# Patient Record
Sex: Female | Born: 1980 | Race: White | Hispanic: No | Marital: Married | Smoking: Never smoker
Health system: Southern US, Community
[De-identification: ages and names within clinical notes are randomized; demographics above are authoritative.]

## PROBLEM LIST (undated history)

## (undated) DIAGNOSIS — F325 Major depressive disorder, single episode, in full remission: Secondary | ICD-10-CM

## (undated) DIAGNOSIS — E039 Hypothyroidism, unspecified: Secondary | ICD-10-CM

## (undated) DIAGNOSIS — O1490 Unspecified pre-eclampsia, unspecified trimester: Secondary | ICD-10-CM

## (undated) DIAGNOSIS — I10 Essential (primary) hypertension: Secondary | ICD-10-CM

## (undated) HISTORY — DX: Hypothyroidism, unspecified: E03.9

## (undated) HISTORY — DX: Major depressive disorder, single episode, in full remission: F32.5

## (undated) HISTORY — DX: Unspecified pre-eclampsia, unspecified trimester: O14.90

## (undated) HISTORY — PX: WISDOM TOOTH EXTRACTION: SHX21

## (undated) HISTORY — DX: Essential (primary) hypertension: I10

---

## 2015-05-14 LAB — LIPID PANEL
Cholesterol: 168 (ref 0–200)
HDL: 89 — AB (ref 35–70)
LDL Cholesterol: 66
Triglycerides: 63 (ref 40–160)

## 2016-04-26 DIAGNOSIS — Z23 Encounter for immunization: Secondary | ICD-10-CM | POA: Diagnosis not present

## 2016-06-02 DIAGNOSIS — H01001 Unspecified blepharitis right upper eyelid: Secondary | ICD-10-CM | POA: Diagnosis not present

## 2016-06-02 DIAGNOSIS — H01004 Unspecified blepharitis left upper eyelid: Secondary | ICD-10-CM | POA: Diagnosis not present

## 2016-06-02 DIAGNOSIS — H04123 Dry eye syndrome of bilateral lacrimal glands: Secondary | ICD-10-CM | POA: Diagnosis not present

## 2016-06-12 DIAGNOSIS — Z23 Encounter for immunization: Secondary | ICD-10-CM | POA: Diagnosis not present

## 2016-06-12 DIAGNOSIS — Z0000 Encounter for general adult medical examination without abnormal findings: Secondary | ICD-10-CM | POA: Diagnosis not present

## 2016-06-12 DIAGNOSIS — E039 Hypothyroidism, unspecified: Secondary | ICD-10-CM | POA: Diagnosis not present

## 2016-11-27 DIAGNOSIS — S300XXA Contusion of lower back and pelvis, initial encounter: Secondary | ICD-10-CM | POA: Diagnosis not present

## 2016-11-27 DIAGNOSIS — W19XXXA Unspecified fall, initial encounter: Secondary | ICD-10-CM | POA: Diagnosis not present

## 2017-01-01 DIAGNOSIS — E039 Hypothyroidism, unspecified: Secondary | ICD-10-CM | POA: Diagnosis not present

## 2017-01-13 DIAGNOSIS — E039 Hypothyroidism, unspecified: Secondary | ICD-10-CM | POA: Diagnosis not present

## 2017-01-27 DIAGNOSIS — Z23 Encounter for immunization: Secondary | ICD-10-CM | POA: Diagnosis not present

## 2017-02-17 DIAGNOSIS — E039 Hypothyroidism, unspecified: Secondary | ICD-10-CM | POA: Diagnosis not present

## 2017-07-28 DIAGNOSIS — H5213 Myopia, bilateral: Secondary | ICD-10-CM | POA: Diagnosis not present

## 2017-08-25 DIAGNOSIS — E039 Hypothyroidism, unspecified: Secondary | ICD-10-CM | POA: Diagnosis not present

## 2017-10-13 DIAGNOSIS — R1010 Upper abdominal pain, unspecified: Secondary | ICD-10-CM | POA: Diagnosis not present

## 2018-01-26 DIAGNOSIS — Z23 Encounter for immunization: Secondary | ICD-10-CM | POA: Diagnosis not present

## 2018-01-26 DIAGNOSIS — E039 Hypothyroidism, unspecified: Secondary | ICD-10-CM | POA: Diagnosis not present

## 2018-01-28 LAB — TSH: TSH: 2.77 (ref <=5.90)

## 2018-04-02 DIAGNOSIS — R1011 Right upper quadrant pain: Secondary | ICD-10-CM | POA: Diagnosis not present

## 2018-04-02 LAB — BASIC METABOLIC PANEL
BUN: 14 (ref 4–21)
Creatinine: 0.7 (ref 0.5–1.1)
Glucose: 72
Potassium: 4.4 (ref 3.4–5.3)
Sodium: 142 (ref 137–147)

## 2018-04-02 LAB — HEPATIC FUNCTION PANEL
ALT: 22 (ref 7–35)
AST: 22 (ref 13–35)
Alkaline Phosphatase: 44 (ref 25–125)
Bilirubin, Total: 0.3

## 2018-04-05 ENCOUNTER — Other Ambulatory Visit: Payer: Self-pay | Admitting: Family Medicine

## 2018-04-05 DIAGNOSIS — R1011 Right upper quadrant pain: Secondary | ICD-10-CM

## 2018-04-16 ENCOUNTER — Ambulatory Visit
Admission: RE | Admit: 2018-04-16 | Discharge: 2018-04-16 | Disposition: A | Discharge status: Home / Self Care | Payer: 59 | Source: Ambulatory Visit | Attending: Family Medicine | Admitting: Family Medicine

## 2018-04-16 DIAGNOSIS — R1011 Right upper quadrant pain: Secondary | ICD-10-CM

## 2018-04-16 DIAGNOSIS — R1010 Upper abdominal pain, unspecified: Secondary | ICD-10-CM | POA: Diagnosis not present

## 2018-04-30 ENCOUNTER — Other Ambulatory Visit: Payer: Self-pay | Admitting: Gastroenterology

## 2018-04-30 DIAGNOSIS — R109 Unspecified abdominal pain: Secondary | ICD-10-CM | POA: Diagnosis not present

## 2018-05-07 ENCOUNTER — Ambulatory Visit
Admission: RE | Admit: 2018-05-07 | Discharge: 2018-05-07 | Disposition: A | Discharge status: Home / Self Care | Payer: 59 | Source: Ambulatory Visit | Attending: Gastroenterology | Admitting: Gastroenterology

## 2018-05-07 DIAGNOSIS — R1011 Right upper quadrant pain: Secondary | ICD-10-CM | POA: Diagnosis not present

## 2018-05-07 DIAGNOSIS — R109 Unspecified abdominal pain: Secondary | ICD-10-CM

## 2018-05-07 MED ORDER — IOPAMIDOL (ISOVUE-300) INJECTION 61%
100.0000 mL | Freq: Once | INTRAVENOUS | Status: AC | PRN
  Administered 2018-05-07: 100 mL via INTRAVENOUS

## 2018-05-18 DIAGNOSIS — R1011 Right upper quadrant pain: Secondary | ICD-10-CM | POA: Diagnosis not present

## 2018-05-20 ENCOUNTER — Ambulatory Visit (INDEPENDENT_AMBULATORY_CARE_PROVIDER_SITE_OTHER): Payer: 59 | Admitting: Family Medicine

## 2018-05-20 ENCOUNTER — Encounter: Payer: Self-pay | Admitting: Family Medicine

## 2018-05-20 VITALS — BP 171/112 | HR 70 | Ht 67.0 in | Wt 127.0 lb

## 2018-05-20 DIAGNOSIS — E039 Hypothyroidism, unspecified: Secondary | ICD-10-CM | POA: Diagnosis not present

## 2018-05-20 DIAGNOSIS — R222 Localized swelling, mass and lump, trunk: Secondary | ICD-10-CM

## 2018-05-20 DIAGNOSIS — R1011 Right upper quadrant pain: Secondary | ICD-10-CM | POA: Diagnosis not present

## 2018-05-20 DIAGNOSIS — F325 Major depressive disorder, single episode, in full remission: Secondary | ICD-10-CM

## 2018-05-20 DIAGNOSIS — R109 Unspecified abdominal pain: Secondary | ICD-10-CM | POA: Diagnosis not present

## 2018-05-20 HISTORY — DX: Major depressive disorder, single episode, in full remission: F32.5

## 2018-05-20 HISTORY — DX: Hypothyroidism, unspecified: E03.9

## 2018-05-20 MED ORDER — DICLOFENAC SODIUM 1 % TD GEL: 4.0000 g | Freq: Four times a day (QID) | TRANSDERMAL | 11 refills | Status: DC

## 2018-05-20 NOTE — Progress Notes (Signed)
Cheryl Christian is a 38 y.o. female who [facility] Cheryl Christian: Primary Care Sports Medicine today for establish care and discuss abdominal pain.  Cheryl Christian is a healthy young woman with a medical history significant for hypothyroidism and well-controlled depression in remission.  She does not have a history of hypertension.  She notes that starting in July 2019 she developed right upper quadrant abdominal pain.  This is been persistent intermittently for the last 6 to 7 months.  The pain was initially pretty well controlled with NSAIDs.  She had some limited work-up off and on including which she reports is normal blood work.  The pain worsened again around December or January and she started having more work-up including a normal blood panel per her recall as well as normal abdominal ultrasound and CT scan of the abdomen and pelvis in February.  She notes the pain is continuing.  Pain is worse with activity and better with rest.  Pain does not seem to change much with eating.  Symptoms are mild to moderate but persistent and annoying.  She is worried they represent something dangerous.  She denies any change with menstruation and feels well otherwise.  She exercises regularly doing some weightlifting and body weight exercises as well as jogging.   ROS as above: No headache, visual changes, nausea, vomiting, diarrhea, constipation, dizziness,  skin rash, fevers, chills, night sweats, weight loss, swollen lymph nodes, body aches, joint swelling, muscle aches, chest pain, shortness of breath, mood changes, visual or auditory hallucinations.    Exam:  BP (!) 171/112   Pulse 70   Ht 5\' 7"  (1.702 m)   Wt 127 lb (57.6 kg)   BMI 19.89 kg/m  Wt Readings from Last 5 Encounters:  05/20/18 127 lb (57.6 kg)    Gen: Well NAD HEENT: EOMI,  MMM Lungs: Normal work of breathing. CTABL Heart: RRR no MRG Abd: NABS, Soft.  Nondistended, tender to palpation right upper quadrant.  With abdominal wall contraction she has a small palpable tender mass just to the right of and superior to the umbilicus.  No umbilical mass.  Not reducible.  Pain is worse with abdominal wall contraction then with rest.  No other abdominal masses palpated.  No rebound or guarding. Exts: Brisk capillary refill, warm and well perfused.   Lab and Radiology Results  Limited bedside musculoskeletal ultrasound obtained today At area of tenderness and palpated nodule patient has a heterogeneous appearance of a mass arising from the muscle layer lateral and superior to the umbilicus.  This measures 0.8 x 1.5 by about 2.5 centimeters.  No increased Doppler activity.  Does not appear to be extending through fascial planes.  Concern for muscle tear.          EXAM: CT ABDOMEN WITH CONTRAST   ADDENDUM REPORT: 05/20/2018 10:36  ADDENDUM: This examination was discussed with Dr Denyse Amassorey on 05/20/2018. Dr Denyse Amassorey reports in office ultrasound revealed a nodule just below the ventral abdominal wall, right of the midline near the umbilicus. After review of CT images there is a well-circumscribed ovoid nodule which appears poorly marginated and has mass effect upon the anterior and inferior liver capsule. This measures approximately 3 by 1.3 by 2.7 cm. This is indeterminate. Potential diagnostic considerations include endometriosis or, less likely peritoneal disease. Further investigation with contrast enhanced MRI of the abdomen is recommended.   Electronically Signed   By: Signa Kellaylor  Stroud M.D.   On: 05/20/2018 10:36  TECHNIQUE:  Multidetector CT imaging of the abdomen was performed using the standard protocol following bolus administration of intravenous contrast.  CONTRAST:  ISOVUE-300 IOPAMIDOL (ISOVUE-300) INJECTION 61%  COMPARISON:  None.  FINDINGS: Lower chest: No acute findings.  Hepatobiliary: No hepatic masses  identified. Gallbladder is unremarkable. No evidence of biliary ductal dilatation.  Pancreas:  No mass or inflammatory changes.  Spleen:  Within normal limits in size and appearance.  Adrenals/Urinary Tract: No masses identified. No evidence of hydronephrosis.  Stomach/Bowel: Visualized portion unremarkable.  Vascular/Lymphatic: No pathologically enlarged lymph nodes identified. No abdominal aortic aneurysm.  Other:  None.  Musculoskeletal:  No suspicious bone lesions identified.  IMPRESSION: Negative.  No acute findings or other significant abnormality.   Electronically Signed   By: Myles Rosenthal M.D.   On: 05/07/2018 12:21  EXAM: ABDOMEN ULTRASOUND COMPLETE  COMPARISON:  None.  FINDINGS: Gallbladder: No gallstones or wall thickening visualized. There is no pericholecystic fluid. No sonographic Murphy sign noted by sonographer.  Common bile duct: Diameter: 2 mm. No intrahepatic, common hepatic, or common bile duct dilatation.  Liver: No focal lesion identified. Within normal limits in parenchymal echogenicity. Portal vein is patent on color Doppler imaging with normal direction of blood flow towards the liver.  IVC: No abnormality visualized.  Pancreas: No pancreatic mass or inflammatory focus.  Spleen: Size and appearance within normal limits.  Right Kidney: Length: 12.4 cm. Echogenicity within normal limits. No mass or hydronephrosis visualized.  Left Kidney: Length: 11.2 cm. Echogenicity within normal limits. No mass or hydronephrosis visualized.  Abdominal aorta: No aneurysm visualized.  Other findings: No demonstrable ascites.  IMPRESSION: Study within normal limits.   Electronically Signed   By: Bretta Bang III M.D.   On: 04/16/2018 14:02 I personally (independently) visualized and performed the interpretation of the images attached in this note.    Assessment and Plan: 38 y.o. female with abdominal pain very  likely arising from abdominal wall musculature.  Concern for tear versus peritoneal findings.  Plan for physical therapy and diclofenac gel.  Additionally we will proceed with MRI with contrast per radiology recommendations.  Blood pressure significantly elevated today.  However patient has a history of normal blood pressures I suspect this is probably anxiety and will recheck on reassessment in 4 to 6 weeks.   PDMP reviewed during this encounter. Orders Placed This Encounter  Procedures  . MR ABDOMEN W CONTRAST    Abdominal wall nodule seen on CT scan and on ultrasound.  Poorly characterized will benefit from MRI with contrast.    Standing Status:   Future    Standing Expiration Date:   07/19/2019    Order Specific Question:   If indicated for the ordered procedure, I authorize the administration of contrast media per Radiology protocol    Answer:   Yes    Order Specific Question:   What is the patient's sedation requirement?    Answer:   No Sedation    Order Specific Question:   Does the patient have a pacemaker or implanted devices?    Answer:   No    Order Specific Question:   Radiology Contrast Protocol - do NOT remove file path    Answer:   [email or web address]    Order Specific Question:   Preferred imaging location?    Answer:   [facility] (table limit-350lbs)  . Ambulatory referral to Physical Therapy    Referral Priority:   Routine    Referral Type:   Physical Medicine  Referral Reason:   Specialty Services Required    Requested Specialty:   Physical Therapy   Meds ordered this encounter  Medications  . diclofenac sodium (VOLTAREN) 1 % GEL    Sig: Apply 4 g topically 4 (four) times daily. To affected joint.    Dispense:  100 g    Refill:  11     Historical information moved to improve visibility of documentation.  Past Medical History:  Diagnosis Date  . Hypothyroid 05/20/2018  . Major depression in complete remission (HCC)  05/20/2018   Past Surgical History:  Procedure Laterality Date  . WISDOM TOOTH EXTRACTION     38 yo   Social History   Tobacco Use  . Smoking status: Never Smoker  . Smokeless tobacco: Never Used  Substance Use Topics  . Alcohol use: Never    Frequency: Never   family history is not on file.  Medications: Current Outpatient Medications  Medication Sig Dispense Refill  . levothyroxine (SYNTHROID, LEVOTHROID) 50 MCG tablet TAKE 1 TABLET BY MOUTH EVERY DAY ON EMPTY STOMACH IN THE MORNING    . sertraline (ZOLOFT) 50 MG tablet Take 50 mg by mouth daily.    . diclofenac sodium (VOLTAREN) 1 % GEL Apply 4 g topically 4 (four) times daily. To affected joint. 100 g 11   No current facility-administered medications for this visit.    No Known Allergies   Discussed warning signs or symptoms. Please see discharge instructions. Patient expresses understanding.

## 2018-05-20 NOTE — Patient Instructions (Addendum)
Thank you for coming in today. I think PT is the next step .  I will ask radiology to look again.  I suspect the next imaging test will be MRI but we may be able to avoid that.  There is something there.   Recheck in 4-6 weeks.  I will keep you updated on what radiology says.   Sign up for mychart.

## 2018-05-27 ENCOUNTER — Encounter: Payer: Self-pay | Admitting: Family Medicine

## 2018-05-31 ENCOUNTER — Encounter: Payer: Self-pay | Admitting: Physical Therapy

## 2018-05-31 ENCOUNTER — Ambulatory Visit: Payer: 59 | Attending: Family Medicine | Admitting: Physical Therapy

## 2018-05-31 DIAGNOSIS — M6281 Muscle weakness (generalized): Secondary | ICD-10-CM | POA: Insufficient documentation

## 2018-05-31 DIAGNOSIS — S39011D Strain of muscle, fascia and tendon of abdomen, subsequent encounter: Secondary | ICD-10-CM | POA: Diagnosis not present

## 2018-05-31 NOTE — Patient Instructions (Addendum)
Access Code: [email or web address]  URL: [email or web address]  Date: 05/31/2018  Prepared by: Ivery Quale   Exercises  TL Sidebending Stretch - Single Arm Overhead - 3 reps - 1 sets - 30 hold - 2x daily - 6x weekly  Prone Press Up - 3 reps - 2-3 sets - 10-30 sec hold - 2x daily - 6x weekly  Sidelying Hip Abduction - 10 reps - 1-3 sets - 2x daily - 6x weekly  Supine Bridge - 10 reps - 2 sets - 5 hold - 2x daily - 6x weekly  Plank on Knees - 3 reps - 1 sets - 30 hold - 2x daily - 6x weekly  Side Plank on Knees - 3 reps - 1 sets - 30 hold - 2x daily - 6x weekly  Bird Dog - 10 reps - 1 sets - 5 sec hold - 2x daily - 6x weekly  Supine Transversus Abdominis Bracing with Leg Extension - 10 reps - 3 sets - 2x daily - 6x weekly  Hooklying Isometric Hip Flexion - 10 reps - 1-2 sets - 5 sec hold - 2x daily - 6x weekly   TENS UNIT: This is helpful for muscle pain and spasm.   Search and Purchase a TENS 7000 2nd edition at www.tenspros.com. It should be less than $30.     TENS unit instructions: Do not shower or bathe with the unit on Turn the unit off before removing electrodes or batteries If the electrodes lose stickiness add a drop of water to the electrodes after they are disconnected from the unit and place on plastic sheet. If you continued to have difficulty, call the TENS unit company to purchase more electrodes. Do not apply lotion on the skin area prior to use. Make sure the skin is clean and dry as this will help prolong the life of the electrodes. After use, always check skin for unusual red areas, rash or other skin difficulties. If there are any skin problems, does not apply electrodes to the same area. Never remove the electrodes from the unit by pulling the wires. Do not use the TENS unit or electrodes other than as directed. Do not change electrode placement without consultating your therapist or physician. Keep 2 fingers with between each electrode. Wear time ratio is 2:1,  on to off times.    For example on for 30 minutes off for 15 minutes and then on for 30 minutes off for 15 minutes

## 2018-05-31 NOTE — Therapy (Signed)
[facility] [address] [place], [place], [address] Phone: [phone]   Fax:  [phone]  Physical Therapy Evaluation  Patient Details  Name: Cheryl Christian MRN: [id number] Date of Birth: [birth year] Referring Provider (PT): Teressa Lower   Encounter Date: 05/31/2018  PT End of Session - 05/31/18 0900    Visit Number  1    Number of Visits  8   pending results of MRI   Date for PT Re-Evaluation  07/12/18    Authorization Type  [employer]    PT Start Time  0802    PT Stop Time  0846    PT Time Calculation (min)  44 min    Activity Tolerance  Patient tolerated treatment well    Behavior During Therapy  [facility] for tasks assessed/performed       Past Medical History:  Diagnosis Date  . Hypothyroid 05/20/2018  . Major depression in complete remission (HCC) 05/20/2018    Past Surgical History:  Procedure Laterality Date  . WISDOM TOOTH EXTRACTION     38 yo    There were no vitals filed for this visit.   Subjective Assessment - 05/31/18 0805    Subjective  Pt relays intermit abdominal pain since July 2019 without know cause of injury. She relays some relief with topical cream and NSAIDS and heat. She can feel the pain is localized to Rt upper quadrant and is wose with sneezing, coughing, crunches. She had imaging showing nodule in abdominal wall and has MRI scheduled 06/07/18.     Pertinent History  hypothyroid    Limitations  Other (comment)    How long can you sit comfortably?  not limited    How long can you stand comfortably?  not limited    How long can you walk comfortably?  not limited    Diagnostic tests  Imaging: had CT showing "nodule in abdominal wall and recommends MRI for further investigation" Has MRI scheduled 06/07/18    Currently in Pain?  Yes    Pain Score  6     Pain Location  Abdomen    Pain Orientation  Right;Upper    Pain Descriptors / Indicators  Aching;Burning;Sore    Pain Type  Chronic pain    Pain Radiating  Towards  denies    Pain Onset  More than a month ago    Pain Frequency  Intermittent    Aggravating Factors   coughing, sneezing, crunches    Pain Relieving Factors  topical cream, heat, NSAIDS    Multiple Pain Sites  No         [facility] PT Assessment - 05/31/18 0001      Assessment   Medical Diagnosis  Suspect abd wall tear    Referring Provider (PT)  Denyse Amass, E    Onset Date/Surgical Date  --   Chronic pain since July 2019   Next MD Visit  not scheduled    Prior Therapy  PT      Precautions   Precautions  None      Balance Screen   Has the patient fallen in the past 6 months  No      Home Environment   Living Environment  Private residence      Prior Function   Level of Independence  Independent      Cognition   Overall Cognitive Status  Within Functional Limits for tasks assessed      Observation/Other Assessments   Focus on Therapeutic Outcomes (FOTO)  not done as may be one time visit and she is not limited functionally      Sensation   Light Touch  Appears Intact      Coordination   Gross Motor Movements are Fluid and Coordinated  Yes      Posture/Postural Control   Posture Comments  WNL      ROM / Strength   AROM / PROM / Strength  AROM;Strength      AROM   Overall AROM Comments  lumbar AROM WNL but does have pull in Rt abdominal with extension and Lt sidebending      Strength   Overall Strength Comments  core strength fair, LE strength WNL except hip abd 4+/5 MMT bilat      Palpation   Palpation comment  tenderness to palpation in Rt abdominal upper quadrant, obliques, rectus abdominous      Special Tests   Other special tests  neg SLR, neg FABERS,      Transfers   Transfers  Independent with all Transfers      Ambulation/Gait   Gait Comments  WNL gait       Objective measurements completed on examination: See above findings.    Exercises and stretches performed today: Prone press up to stretch abdominals 30 sec X 1 and standing  sidebending stretch to Lt with outstretched arm 30 sec X 1 Transverse abdominal contractions 10 sec X 3, then with leg ext X 1 ea, then with isometric hip flexion X 1 ea  [facility] Adult PT Treatment/Exercise - 05/31/18 0001      Modalities   Modalities  Moist Heat;Electrical Stimulation      Moist Heat Therapy   Number Minutes Moist Heat  15 Minutes    Moist Heat Location  Other (comment)   abdominals     Electrical Stimulation   Electrical Stimulation Location  Rt abdominals    Electrical Stimulation Action  IFC    Electrical Stimulation Parameters  tolerance    Electrical Stimulation Goals  Pain             PT Education - 05/31/18 0859    Education Details  HEP, POC, TENS    Person(s) Educated  Patient    Methods  Explanation;Demonstration;Verbal cues;Handout    Comprehension  Verbalized understanding;Returned demonstration          PT Long Term Goals - 05/31/18 0908      PT LONG TERM GOAL #1   Title  Pt will be I and compliant with HEP. ( 6 weeks 07/12/18)    Status  New      PT LONG TERM GOAL #2   Title  Pt will report no more than 2/10 pain with usual activity and with taking care of her kids. ( 6 weeks 07/12/18)    Baseline  6    Status  New      PT LONG TERM GOAL #3   Title  Pt will be able to hold plank and sideplank at least 45 seconds to show good core stabilization. ( 6 weeks 07/12/18)    Status  New             Plan - 05/31/18 0901    Clinical Impression Statement  Pt presents with Rt abdominal pain and Suspected strain or abd wall tear. She will have MRI on 06/07/18 so PT will be held until MRI results are attained. She was given HEP for gentle core stabilization and stretching along with recommendation for home  TENS unit for pain.  She will F/U with MD after MRI and then if she is referred back to PT pending MRI results then she would benefit from PT for core exercises and pain management.     Personal Factors and Comorbidities  Comorbidity 1     Comorbidities  hypothyroid    Stability/Clinical Decision Making  Stable/Uncomplicated    Clinical Decision Making  Low    Rehab Potential  Good    PT Frequency  Other (comment)   1-2   PT Duration  6 weeks    PT Treatment/Interventions  Electrical Stimulation;Iontophoresis /ml Dexamethasone;Moist Heat;Ultrasound;Therapeutic activities;Therapeutic exercise;Neuromuscular re-education;Passive range of motion;Dry needling;Taping    PT Next Visit Plan  core exercises and abdominal stretching, STM and modalties PRN    PT Home Exercise Plan  prone press up for ab stretch, Lt sidebending for ab stretch with outstretched arm, TAC with leg ext, TAC isometric hip flexion, bridge, birdog, plank on knees, sideplank on knees    Consulted and Agree with Plan of Care  Patient       Patient will benefit from skilled therapeutic intervention in order to improve the following deficits and impairments:  Decreased activity tolerance, Decreased endurance, Decreased strength, Increased fascial restricitons, Increased muscle spasms, Pain  Visit Diagnosis: Muscle weakness (generalized)  Abdominal muscle strain, subsequent encounter     Problem List Patient Active Problem List   Diagnosis Date Noted  . 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 Hypothyroid 05/20/2018  . Abdominal wall mass 05/20/2018    Birdie Riddle 05/31/2018, 9:15 AM  [facility] [address] [place], [place], [address] Phone: [phone]   Fax:  [phone]  Name: Cheryl Christian MRN: [id number] Date of Birth: [birth year]

## 2018-06-01 ENCOUNTER — Telehealth: Payer: Self-pay | Admitting: Family Medicine

## 2018-06-01 NOTE — Telephone Encounter (Signed)
Received records from [place]. We will abstract labs however quick review of labs were essentially normal.  Pap smear not included.

## 2018-06-02 ENCOUNTER — Encounter: Payer: Self-pay | Admitting: Family Medicine

## 2018-06-07 ENCOUNTER — Ambulatory Visit: Payer: 59

## 2018-06-07 DIAGNOSIS — R1011 Right upper quadrant pain: Secondary | ICD-10-CM

## 2018-06-07 DIAGNOSIS — R222 Localized swelling, mass and lump, trunk: Secondary | ICD-10-CM

## 2018-06-07 DIAGNOSIS — R109 Unspecified abdominal pain: Secondary | ICD-10-CM

## 2018-06-07 MED ORDER — GADOBENATE DIMEGLUMINE 529 MG/ML IV SOLN
10.0000 mL | Freq: Once | INTRAVENOUS | Status: AC | PRN
  Administered 2018-06-07: 10 mL via INTRAVENOUS

## 2018-06-22 ENCOUNTER — Ambulatory Visit (INDEPENDENT_AMBULATORY_CARE_PROVIDER_SITE_OTHER): Payer: 59 | Admitting: Family Medicine

## 2018-06-22 ENCOUNTER — Other Ambulatory Visit: Payer: Self-pay

## 2018-06-22 ENCOUNTER — Encounter: Payer: Self-pay | Admitting: Family Medicine

## 2018-06-22 VITALS — BP 154/103 | HR 72 | Temp 98.0°F | Wt 127.0 lb

## 2018-06-22 DIAGNOSIS — N809 Endometriosis, unspecified: Secondary | ICD-10-CM

## 2018-06-22 DIAGNOSIS — R222 Localized swelling, mass and lump, trunk: Secondary | ICD-10-CM

## 2018-06-22 DIAGNOSIS — I10 Essential (primary) hypertension: Secondary | ICD-10-CM

## 2018-06-22 HISTORY — DX: Essential (primary) hypertension: I10

## 2018-06-22 MED ORDER — HYDROCHLOROTHIAZIDE 12.5 MG PO TABS: 12.5000 mg | ORAL_TABLET | Freq: Every day | ORAL | 1 refills | Status: DC

## 2018-06-22 MED ORDER — NORGESTIMATE-ETH ESTRADIOL 0.25-35 MG-MCG PO TABS: 1.0000 | ORAL_TABLET | Freq: Every day | ORAL | 4 refills | Status: DC

## 2018-06-22 NOTE — Progress Notes (Signed)
Cheryl Christian is a 38 y.o. female who presents to [facility]: Primary Care Sports Medicine today for follow-up abdominal pain and hypertension.  Cheryl Christian has been seen multiple times for subtle abdominal pain located in her upper right quadrant.  She had ultrasound that was nondiagnostic and CT scan that on repeat read did show a small mass.  She had follow-up MRI with and without IV contrast that showed a subtle mass on the posterior margin of the right rectus abdominis adjacent to the inferior margin of the left hepatic lobe.  This was thought to be potentially endometriosis or other peritoneal disease.  When asked she notes that sometimes her symptoms do seem to cycle with her menstrual period.  She notes that her pain will typically worsen a day or 2 after her maximum cramping for with her menstrual cycle.  She currently is using vasectomy for birth control.  In the past she is tolerated combined oral contraceptives quite well.  She is done Ortho-Cyclen in the past which she tolerated..  Additionally she has elevated blood pressure. Cheryl Christian notes a positive family history for hypertension.  Her mother required blood pressure medications at about the same age that Cheryl Christian is now.  Additionally she has been keeping a home blood pressure log.  Her blood pressures range into the 130s or 140s frequently.  She does have some times where her systolic blood pressure is in the 120s.  She denies chest pain lightheadedness or dizziness.  She does not take any medications currently for blood pressure.   ROS as above:  Exam:  BP (!) 154/103    Pulse 72    Temp 98 F (36.7 C) (Oral)    Wt 127 lb (57.6 kg)    BMI 19.89 kg/m   Wt Readings from Last 5 Encounters:  06/22/18 127 lb (57.6 kg)  05/20/18 127 lb (57.6 kg)    Gen: Well NAD   Lab and Radiology Results   Chemistry      Component Value Date/Time   NA 142  04/02/2018   K 4.4 04/02/2018   BUN 14 04/02/2018   CREATININE 0.7 04/02/2018   GLU 72 04/02/2018      Component Value Date/Time   ALKPHOS 44 04/02/2018   AST 22 04/02/2018   ALT 22 04/02/2018      Ct Abdomen W Contrast  Addendum Date: 05/20/2018   ADDENDUM REPORT: 05/20/2018 10:36 ADDENDUM: This examination was discussed with Dr Denyse Amass on 05/20/2018. Dr Denyse Amass reports in office ultrasound revealed a nodule just below the ventral abdominal wall, right of the midline near the umbilicus. After review of CT images there is a well-circumscribed ovoid nodule which appears poorly marginated and has mass effect upon the anterior and inferior liver capsule. This measures approximately 3 by 1.3 by 2.7 cm. This is indeterminate. Potential diagnostic considerations include endometriosis or, less likely peritoneal disease. Further investigation with contrast enhanced MRI of the abdomen is recommended. Electronically Signed   By: Signa Kell M.D.   On: 05/20/2018 10:36   Result Date: 05/20/2018 CLINICAL DATA:  Right upper quadrant and right flank pain for 7 months. EXAM: CT ABDOMEN WITH CONTRAST TECHNIQUE: Multidetector CT imaging of the abdomen was performed using the standard protocol following bolus administration of intravenous contrast. CONTRAST:  ISOVUE-300 IOPAMIDOL (ISOVUE-300) INJECTION 61% COMPARISON:  None. FINDINGS: Lower chest: No acute findings. Hepatobiliary: No hepatic masses identified. Gallbladder is unremarkable. No evidence of biliary ductal dilatation. Pancreas:  No mass or inflammatory changes. Spleen:  Within normal limits in size and appearance. Adrenals/Urinary Tract: No masses identified. No evidence of hydronephrosis. Stomach/Bowel: Visualized portion unremarkable. Vascular/Lymphatic: No pathologically enlarged lymph nodes identified. No abdominal aortic aneurysm. Other:  None. Musculoskeletal:  No suspicious bone lesions identified. IMPRESSION: Negative.  No acute findings or  other significant abnormality. Electronically Signed: By: Myles Rosenthal M.D. On: 05/07/2018 12:21   Mr Abdomen Wwo Contrast  Result Date: 06/07/2018 CLINICAL DATA:  Right upper quadrant pain and right flank pain for 7 months. Possible abdominal wall or hepatic capsular mass on recent CT. EXAM: MRI ABDOMEN WITHOUT AND WITH CONTRAST TECHNIQUE: Multiplanar multisequence MR imaging of the abdomen was performed both before and after the administration of intravenous contrast. CONTRAST:  24mL MULTIHANCE GADOBENATE DIMEGLUMINE 529 MG/ML IV SOLN COMPARISON:  CT on 05/07/2018 FINDINGS: Lower chest: No acute findings. Hepatobiliary: No hepatic masses identified. Gallbladder is unremarkable. No evidence of biliary ductal dilatation. Pancreas:  No mass or inflammatory changes. Spleen:  Within normal limits in size and appearance. Adrenals/Urinary Tract: No masses identified. No evidence of hydronephrosis. Stomach/Bowel: Visualized portion unremarkable. Vascular/Lymphatic: No pathologically enlarged lymph nodes identified. No abdominal aortic aneurysm. Other: A subtle plaque-like area is seen along the posterior margin of the right rectus abdominus muscle adjacent to the inferior margin of the left hepatic lobe. This shows mild T2 hyperintensity and contrast enhancement, and measures approximately 1.9 x 0.8 cm. No other areas of abnormal peritoneal thickening or enhancement identified within the abdomen. No evidence of abdominal ascites. Musculoskeletal:  No suspicious bone lesions identified. IMPRESSION: Subtle plaque-like area along posterior margin of right rectus abdominus muscle, adjacent to the inferior margin of the left hepatic lobe. This appears similar to previous CT, and is of uncertain etiology. Differential diagnosis includes endometriosis and other peritoneal disease. Otherwise unremarkable exam. Electronically Signed   By: Myles Rosenthal M.D.   On: 06/07/2018 13:02   US Abdomen Complete  Result Date:  04/16/2018 CLINICAL DATA:  Upper abdominal pain EXAM: ABDOMEN ULTRASOUND COMPLETE COMPARISON:  None. FINDINGS: Gallbladder: No gallstones or wall thickening visualized. There is no pericholecystic fluid. No sonographic Murphy sign noted by sonographer. Common bile duct: Diameter: 2 mm. No intrahepatic, common hepatic, or common bile duct dilatation. Liver: No focal lesion identified. Within normal limits in parenchymal echogenicity. Portal vein is patent on color Doppler imaging with normal direction of blood flow towards the liver. IVC: No abnormality visualized. Pancreas: No pancreatic mass or inflammatory focus. Spleen: Size and appearance within normal limits. Right Kidney: Length: 12.4 cm. Echogenicity within normal limits. No mass or hydronephrosis visualized. Left Kidney: Length: 11.2 cm. Echogenicity within normal limits. No mass or hydronephrosis visualized. Abdominal aorta: No aneurysm visualized. Other findings: No demonstrable ascites. IMPRESSION: Study within normal limits. Electronically Signed   By: Bretta Bang III M.D.   On: 04/16/2018 14:02   I personally (independently) visualized and performed the interpretation of the images attached in this note.    Assessment and Plan: 38 y.o. female with  Abdominal mass: Etiology remains unclear.  Based on MRI findings I think endometriosis is a very likely possibility.  After discussion plan for trial of combined oral contraceptives.  Additionally refer to OB/GYN. Recheck as needed.  Keep me updated.  Hypertension: Blood pressure remains elevated.  She has elevated blood pressure at home under ideal circumstances as well as in clinic.  She has a family history of hypertension.  Plan to start low-dose hydrochlorothiazide and keep home blood pressure  log.  Will check labs in about 3 weeks.  PDMP not reviewed this encounter. Orders Placed This Encounter  Procedures   Ambulatory referral to Obstetrics / Gynecology    Referral Priority:    Routine    Referral Type:   Consultation    Referral Reason:   Specialty Services Required    Requested Specialty:   Obstetrics and Gynecology    Number of Visits Requested:   1   Meds ordered this encounter  Medications   hydrochlorothiazide (HYDRODIURIL) 12.5 MG tablet    Sig: Take 1 tablet (12.5 mg total) by mouth daily.    Dispense:  90 tablet    Refill:  1   norgestimate-ethinyl estradiol (ORTHO-CYCLEN,SPRINTEC,PREVIFEM) 0.25-35 MG-MCG tablet    Sig: Take 1 tablet by mouth daily. Skip placebo week    Dispense:  3 Package    Refill:  4     Historical information moved to improve visibility of documentation.  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  Medications: Current Outpatient Medications  Medication Sig Dispense Refill   diclofenac sodium (VOLTAREN) 1 % GEL Apply 4 g topically 4 (four) times daily. To affected joint. 100 g 11   levothyroxine (SYNTHROID, LEVOTHROID) 50 MCG tablet TAKE 1 TABLET BY MOUTH EVERY DAY ON EMPTY STOMACH IN THE MORNING     sertraline (ZOLOFT) 50 MG tablet Take 50 mg by mouth daily.     hydrochlorothiazide (HYDRODIURIL) 12.5 MG tablet Take 1 tablet (12.5 mg total) by mouth daily. 90 tablet 1   norgestimate-ethinyl estradiol (ORTHO-CYCLEN,SPRINTEC,PREVIFEM) 0.25-35 MG-MCG tablet Take 1 tablet by mouth daily. Skip placebo week 3 Package 4   No current facility-administered medications for this visit.    No Known Allergies   Discussed warning signs or symptoms. Please see discharge instructions. Patient expresses  understanding.

## 2018-06-22 NOTE — Patient Instructions (Signed)
Thank you for coming in today. For belly pain start orthocycline daily. Skip the placebo week.  You should hear about referral to OBGYN. Let me know how you are doing.   For hypertension start HCTZ in the morning. Keep track of blood pressure. We will likely want to get labs in 3-4 weeks.  I will send you a reminder.  Keep me updated on blood pressure.    Endometriosis  Endometriosis is a condition in which the tissue that lines the uterus (endometrium) grows outside of its normal location. The tissue may grow in many locations close to the uterus, but it commonly grows on the ovaries, fallopian tubes, vagina, or bowel. When the uterus sheds the endometrium every menstrual cycle, there is bleeding wherever the endometrial tissue is located. This can cause pain because blood is irritating to tissues that are not normally exposed to it. What are the causes? The cause of endometriosis is not known. What increases the risk? You may be more likely to develop endometriosis if you:  Have a family history of endometriosis.  Have never given birth.  Started your period at age 28 or younger.  Have high levels of estrogen in your body.  Were exposed to a certain medicine (diethylstilbestrol) before you were born (in utero).  Had low birth weight.  Were born as a twin, triplet, or other multiple.  Have a BMI of less than 25. BMI is an estimate of body fat and is calculated from height and weight. What are the signs or symptoms? Often, there are no symptoms of this condition. If you do have symptoms, they may:  Vary depending on where your endometrial tissue is growing.  Occur during your menstrual period (most common) or midcycle.  Come and go, or you may go months with no symptoms at all.  Stop with menopause. Symptoms may include:  Pain in the back or abdomen.  Heavier bleeding during periods.  Pain during sex.  Painful bowel movements.  Infertility.  Pelvic pain.   Bleeding more than once a month. How is this diagnosed? This condition is diagnosed based on your symptoms and a physical exam. You may have tests, such as:  Blood tests and urine tests. These may be done to help rule out other possible causes of your symptoms.  Ultrasound, to look for abnormal tissues.  An X-ray of the lower bowel (barium enema).  An ultrasound that is done through the vagina (transvaginally).  CT scan.  MRI.  Laparoscopy. In this procedure, a lighted, pencil-sized instrument called a laparoscope is inserted into your abdomen through an incision. The laparoscope allows your health care provider to look at the organs inside your body and check for abnormal tissue to confirm the diagnosis. If abnormal tissue is found, your health care provider may remove a small piece of tissue (biopsy) to be examined under a microscope. How is this treated? Treatment for this condition may include:  Medicines to relieve pain, such as NSAIDs.  Hormone therapy. This involves using artificial (synthetic) hormones to reduce endometrial tissue growth. Your health care provider may recommend using a hormonal form of birth control, or other medicines.  Surgery. This may be done to remove abnormal endometrial tissue. ? In some cases, tissue may be removed using a laparoscope and a laser (laparoscopic laser treatment). ? In severe cases, surgery may be done to remove the fallopian tubes, uterus, and ovaries (hysterectomy). Follow these instructions at home:  Take over-the-counter and prescription medicines only as told by  your health care provider.  Do not drive or use heavy machinery while taking prescription pain medicine.  Try to avoid activities that cause pain, including sexual activity.  Keep all follow-up visits as told by your health care provider. This is important. Contact a health care provider if:  You have pain in the area between your hip bones (pelvic area) that occurs: ?  Before, during, or after your period. ? In between your period and gets worse during your period. ? During or after sex. ? With bowel movements or urination, especially during your period.  You have problems getting pregnant.  You have a fever. Get help right away if:  You have severe pain that does not get better with medicine.  You have severe nausea and vomiting, or you cannot eat without vomiting.  You have pain that affects only the lower, right side of your abdomen.  You have abdominal pain that gets worse.  You have abdominal swelling.  You have blood in your stool. This information is not intended to replace advice given to you by your health care provider. Make sure you discuss any questions you have with your health care provider. Document Released: 03/14/2000 Document Revised: 12/21/2015 Document Reviewed: 08/18/2015 Elsevier Interactive Patient Education  2019 Elsevier Inc.   Hydrochlorothiazide, HCTZ capsules or tablets What is this medicine? HYDROCHLOROTHIAZIDE (hye droe klor oh THYE a zide) is a diuretic. It increases the amount of urine passed, which causes the body to lose salt and water. This medicine is used to treat high blood pressure. It is also reduces the swelling and water retention caused by various medical conditions, such as heart, liver, or kidney disease. This medicine may be used for other purposes; ask your health care provider or pharmacist if you have questions. COMMON BRAND NAME(S): Esidrix, Ezide, HydroDIURIL, Microzide, Oretic, Zide What should I tell my health care provider before I take this medicine? They need to know if you have any of these conditions: -diabetes -gout -immune system problems, like lupus -kidney disease or kidney stones -liver disease -pancreatitis -small amount of urine or difficulty passing urine -an unusual or allergic reaction to hydrochlorothiazide, sulfa drugs, other medicines, foods, dyes, or preservatives  -pregnant or trying to get pregnant -breast-feeding How should I use this medicine? Take this medicine by mouth with a glass of water. Follow the directions on the prescription label. Take your medicine at regular intervals. Remember that you will need to pass urine frequently after taking this medicine. Do not take your doses at a time of day that will cause you problems. Do not stop taking your medicine unless your doctor tells you to. Talk to your pediatrician regarding the use of this medicine in children. Special care may be needed. Overdosage: If you think you have taken too much of this medicine contact a poison control center or emergency room at once. NOTE: This medicine is only for you. Do not share this medicine with others. What if I miss a dose? If you miss a dose, take it as soon as you can. If it is almost time for your next dose, take only that dose. Do not take double or extra doses. What may interact with this medicine? -cholestyramine -colestipol -digoxin -dofetilide -lithium -medicines for blood pressure -medicines for diabetes -medicines that relax muscles for surgery -other diuretics -steroid medicines like prednisone or cortisone This list may not describe all possible interactions. Give your health care provider a list of all the medicines, herbs, non-prescription drugs, or  dietary supplements you use. Also tell them if you smoke, drink alcohol, or use illegal drugs. Some items may interact with your medicine. What should I watch for while using this medicine? Visit your doctor or health care professional for regular checks on your progress. Check your blood pressure as directed. Ask your doctor or health care professional what your blood pressure should be and when you should contact him or her. You may need to be on a special diet while taking this medicine. Ask your doctor. Check with your doctor or health care professional if you get an attack of severe diarrhea,  nausea and vomiting, or if you sweat a lot. The loss of too much body fluid can make it dangerous for you to take this medicine. You may get drowsy or dizzy. Do not drive, use machinery, or do anything that needs mental alertness until you know how this medicine affects you. Do not stand or sit up quickly, especially if you are an older patient. This reduces the risk of dizzy or fainting spells. Alcohol may interfere with the effect of this medicine. Avoid alcoholic drinks. This medicine may affect your blood sugar level. If you have diabetes, check with your doctor or health care professional before changing the dose of your diabetic medicine. This medicine can make you more sensitive to the sun. Keep out of the sun. If you cannot avoid being in the sun, wear protective clothing and use sunscreen. Do not use sun lamps or tanning beds/booths. What side effects may I notice from receiving this medicine? Side effects that you should report to your doctor or health care professional as soon as possible: -allergic reactions such as skin rash or itching, hives, swelling of the lips, mouth, tongue, or throat -changes in vision -chest pain -eye pain -fast or irregular heartbeat -feeling faint or lightheaded, falls -gout attack -muscle pain or cramps -pain or difficulty when passing urine -pain, tingling, numbness in the hands or feet -redness, blistering, peeling or loosening of the skin, including inside the mouth -unusually weak or tired Side effects that usually do not require medical attention (report to your doctor or health care professional if they continue or are bothersome): -change in sex drive or performance -dry mouth -headache -stomach upset This list may not describe all possible side effects. Call your doctor for medical advice about side effects. You may report side effects to FDA at 1-800-FDA-1088. Where should I keep my medicine? Keep out of the reach of children. Store at room  temperature between 15 and 30 degrees C (59 and 86 degrees F). Do not freeze. Protect from light and moisture. Keep container closed tightly. Throw away any unused medicine after the expiration date. NOTE: This sheet is a summary. It may not cover all possible information. If you have questions about this medicine, talk to your doctor, pharmacist, or health care provider.  2019 Elsevier/Gold Standard (2009-11-09 12:57:37)

## 2018-06-28 ENCOUNTER — Encounter: Payer: Self-pay | Admitting: Obstetrics & Gynecology

## 2018-06-28 ENCOUNTER — Other Ambulatory Visit: Payer: Self-pay

## 2018-06-28 ENCOUNTER — Ambulatory Visit (INDEPENDENT_AMBULATORY_CARE_PROVIDER_SITE_OTHER): Payer: 59 | Admitting: Obstetrics & Gynecology

## 2018-06-28 VITALS — BP 140/91 | HR 82 | Resp 16 | Ht 66.0 in | Wt 128.0 lb

## 2018-06-28 DIAGNOSIS — Z01419 Encounter for gynecological examination (general) (routine) without abnormal findings: Secondary | ICD-10-CM

## 2018-06-28 DIAGNOSIS — Z1151 Encounter for screening for human papillomavirus (HPV): Secondary | ICD-10-CM | POA: Diagnosis not present

## 2018-06-28 DIAGNOSIS — Z124 Encounter for screening for malignant neoplasm of cervix: Secondary | ICD-10-CM | POA: Diagnosis not present

## 2018-06-28 NOTE — Progress Notes (Signed)
Subjective:    Cheryl Christian is a 38 y.o. married P2 (8 and 77 yo kids) female who presents for an annual exam. The patient has no complaints today. She has RUQ pain intermittent since 7/19. She had a CT and MRI that showed an unusual finding of a shallow possible mass under her rectus muscle. The pain lasts at least a week after her periods, thinks that it may start with her periods.  The patient is sexually active. GYN screening history: last pap: was normal. The patient wears seatbelts: yes. The patient participates in regular exercise: yes. Has the patient ever been transfused or tattooed?: yes. The patient reports that there is not domestic violence in her life.   Menstrual History: OB History    Gravida  3   Para  2   Term  2   Preterm      AB  1   Living  2     SAB  1   TAB      Ectopic      Multiple      Live Births              Menarche age: 66 Patient's last menstrual period was 06/13/2018.    The following portions of the patient's history were reviewed and updated as appropriate: allergies, current medications, past family history, past medical history, past social history, past surgical history and problem list.  Review of Systems Pertinent items are noted in HPI.   Married since 2007 Her husband had a vasectomy. She is an Chartered loss adjuster (mysteries for teens). Had a flu vaccine this year. FH- No breast/gyn/colon cancer Periods about q 4 weeks, bleeds for 3-4 days, some cramping, takes IBU about 400mg  q 6 hours for 1-2 days.   Objective:    BP (!) 140/91   Pulse 82   Resp 16   Ht 5\' 6"  (1.676 m)   Wt 128 lb (58.1 kg)   LMP 06/13/2018   BMI 20.66 kg/m   General Appearance:    Alert, cooperative, no distress, appears stated age  Head:    Normocephalic, without obvious abnormality, atraumatic  Eyes:    PERRL, conjunctiva/corneas clear, EOM's intact, fundi    benign, both eyes  Ears:    Normal TM's and external ear canals, both ears  Nose:   Nares normal,  septum midline, mucosa normal, no drainage    or sinus tenderness  Throat:   Lips, mucosa, and tongue normal; teeth and gums normal  Neck:   Supple, symmetrical, trachea midline, no adenopathy;    thyroid:  no enlargement/tenderness/nodules; no carotid   bruit or JVD  Back:     Symmetric, no curvature, ROM normal, no CVA tenderness  Lungs:     Clear to auscultation bilaterally, respirations unlabored  Chest Wall:    No tenderness or deformity   Heart:    Regular rate and rhythm, S1 and S2 normal, no murmur, rub   or gallop  Breast Exam:    No tenderness, masses, or nipple abnormality  Abdomen:     Soft, non-tender, bowel sounds active all four quadrants,    no masses, no organomegaly  Genitalia:    Normal female without lesion, discharge or tenderness, normal size and shape, midplane, mobile, non-tender, normal adnexal exam      Extremities:   Extremities normal, atraumatic, no cyanosis or edema  Pulses:   2+ and symmetric all extremities  Skin:   Skin color, texture, turgor normal, no rashes or  lesions  Lymph nodes:   Cervical, supraclavicular, and axillary nodes normal  Neurologic:   CNII-XII intact, normal strength, sensation and reflexes    throughout  .    Assessment:    Healthy female exam.   Unusual rectus muscle finding   Plan:     Thin prep Pap smear. with cotesting She started on OCPs last week. I will consult with a general surgeon and see if the pain is resolved with OCPs. If it resolves, then I would suspect that it is endometriosis. If not, then I would rec gen surg consult and possible laparoscopy. Come back 4 months.

## 2018-06-28 NOTE — Progress Notes (Signed)
Pt states last pap smear was 2016-normal

## 2018-06-30 LAB — CYTOLOGY - PAP
Diagnosis: NEGATIVE
HPV: NOT DETECTED

## 2018-07-01 ENCOUNTER — Encounter: Payer: Self-pay | Admitting: Family Medicine

## 2018-07-01 ENCOUNTER — Ambulatory Visit: Payer: 59 | Admitting: Family Medicine

## 2018-07-01 DIAGNOSIS — E039 Hypothyroidism, unspecified: Secondary | ICD-10-CM

## 2018-07-01 DIAGNOSIS — I10 Essential (primary) hypertension: Secondary | ICD-10-CM

## 2018-07-08 DIAGNOSIS — I10 Essential (primary) hypertension: Secondary | ICD-10-CM | POA: Diagnosis not present

## 2018-07-08 DIAGNOSIS — E039 Hypothyroidism, unspecified: Secondary | ICD-10-CM | POA: Diagnosis not present

## 2018-07-09 LAB — BASIC METABOLIC PANEL WITH GFR
BUN: 14 mg/dL (ref 7–25)
CO2: 27 mmol/L (ref 20–32)
Calcium: 9.1 mg/dL (ref 8.6–10.2)
Chloride: 103 mmol/L (ref 98–110)
Creat: 0.84 mg/dL (ref 0.50–1.10)
GFR, Est African American: 101 mL/min/{1.73_m2} (ref >=60)
GFR, Est Non African American: 88 mL/min/{1.73_m2} (ref >=60)
Glucose, Bld: 106 mg/dL — ABNORMAL HIGH (ref 65–99)
Potassium: 3.8 mmol/L (ref 3.5–5.3)
Sodium: 138 mmol/L (ref 135–146)

## 2018-07-09 LAB — TSH: TSH: 2.03 mIU/L

## 2018-09-30 ENCOUNTER — Other Ambulatory Visit: Payer: Self-pay | Admitting: Family Medicine

## 2018-09-30 MED ORDER — HYDROCHLOROTHIAZIDE 12.5 MG PO TABS: 12.5000 mg | ORAL_TABLET | Freq: Every day | ORAL | 0 refills | Status: DC

## 2018-11-04 ENCOUNTER — Other Ambulatory Visit: Payer: Self-pay

## 2018-11-04 ENCOUNTER — Telehealth (INDEPENDENT_AMBULATORY_CARE_PROVIDER_SITE_OTHER): Payer: 59 | Admitting: Obstetrics & Gynecology

## 2018-11-04 ENCOUNTER — Encounter: Payer: Self-pay | Admitting: Obstetrics & Gynecology

## 2018-11-04 DIAGNOSIS — R102 Pelvic and perineal pain: Secondary | ICD-10-CM | POA: Diagnosis not present

## 2018-11-04 MED ORDER — NORGESTREL-ETHINYL ESTRADIOL 0.3-30 MG-MCG PO TABS: 1.0000 | ORAL_TABLET | Freq: Every day | ORAL | 11 refills | Status: DC

## 2018-11-04 NOTE — Progress Notes (Signed)
    TELEHEALTH GYNECOLOGY VIRTUAL VIDEO VISIT ENCOUNTER NOTE  Provider location: [facility] at [facility]   I connected with Cheryl Christian on 11/04/18 at  3:15 PM EDT by MyChart Video Encounter at home and verified that I am speaking with the correct person using two identifiers.   I discussed the limitations, risks, security and privacy concerns of performing an evaluation and management service virtually and the availability of in person appointments. I also discussed with the patient that there may be a patient responsible charge related to this service. The patient expressed understanding and agreed to proceed.   History:  Cheryl Christian is a 38 y.o. [phone] female being evaluated today for follow up after starting [place]. I prescribed this due to a unusual finding on a CT and MRI. These were done for Abdominal pain. Since starting the sprintec in a continuous fashion, the pain has improved. It used to be present about 7 days o fthe month and now it is about 1 day every 10 days. She has daily spotting.    Past Medical History:  Diagnosis Date  . Essential hypertension 06/22/2018  . Hypothyroid 05/20/2018  . Major depression in complete remission ([place]) 05/20/2018   Past Surgical History:  Procedure Laterality Date  . WISDOM TOOTH EXTRACTION     38 yo   The following portions of the patient's history were reviewed and updated as appropriate: allergies, current medications, past family history, past medical history, past social history, past surgical history and problem list.     Review of Systems:  Pertinent items noted in HPI and remainder of comprehensive ROS otherwise negative.  Physical Exam:   General:  Alert, oriented and cooperative. Patient appears to be in no acute distress.  Mental Status: Normal mood and affect. Normal behavior. Normal judgment and thought content.   Respiratory: Normal respiratory effort, no problems with respiration noted  Rest of  physical exam deferred due to type of encounter  Labs and Imaging No results found for this or any previous visit (from the past 336 hour(s)). No results found.     Assessment and Plan:     Pelvic pain- improved with continuous sprintec- rec BP check (she has cuff at home) Spotting-  Change OCPs to lo ovral     I offered a referral to gen surgeon any time she wants that.  I discussed the assessment and treatment plan with the patient. The patient was provided an opportunity to ask questions and all were answered. The patient agreed with the plan and demonstrated an understanding of the instructions.   The patient was advised to call back or seek an in-person evaluation/go to the ED if the symptoms worsen or if the condition fails to improve as anticipated.  I provided 10 minutes of face-to-face time during this encounter.   Emily Filbert, MD [facility], [place]

## 2018-11-05 ENCOUNTER — Encounter

## 2018-11-28 ENCOUNTER — Other Ambulatory Visit: Payer: Self-pay | Admitting: Family Medicine

## 2018-12-07 ENCOUNTER — Encounter: Payer: Self-pay | Admitting: Family Medicine

## 2018-12-08 MED ORDER — SERTRALINE HCL 50 MG PO TABS: 50.0000 mg | ORAL_TABLET | Freq: Every day | ORAL | 3 refills | Status: DC

## 2018-12-08 MED ORDER — LEVOTHYROXINE SODIUM 50 MCG PO TABS: ORAL_TABLET | ORAL | 3 refills | Status: DC

## 2018-12-14 ENCOUNTER — Encounter: Payer: Self-pay | Admitting: Obstetrics & Gynecology

## 2018-12-23 ENCOUNTER — Other Ambulatory Visit: Payer: Self-pay

## 2018-12-27 ENCOUNTER — Other Ambulatory Visit: Payer: Self-pay

## 2018-12-27 ENCOUNTER — Ambulatory Visit (INDEPENDENT_AMBULATORY_CARE_PROVIDER_SITE_OTHER): Payer: 59 | Admitting: Obstetrics & Gynecology

## 2018-12-27 ENCOUNTER — Encounter: Payer: Self-pay | Admitting: Obstetrics & Gynecology

## 2018-12-27 VITALS — BP 160/108 | HR 84 | Temp 97.3°F | Ht 66.5 in | Wt 133.8 lb

## 2018-12-27 DIAGNOSIS — R222 Localized swelling, mass and lump, trunk: Secondary | ICD-10-CM

## 2018-12-27 DIAGNOSIS — N912 Amenorrhea, unspecified: Secondary | ICD-10-CM

## 2018-12-27 DIAGNOSIS — N809 Endometriosis, unspecified: Secondary | ICD-10-CM

## 2018-12-27 LAB — POCT URINE PREGNANCY: Preg Test, Ur: NEGATIVE

## 2018-12-27 MED ORDER — NORETHINDRONE 0.35 MG PO TABS: 1.0000 | ORAL_TABLET | Freq: Every day | ORAL | 3 refills | Status: DC

## 2018-12-27 NOTE — Progress Notes (Signed)
38 y.o. [id number] Married White or Caucasian female here as new patient for questions regarding endometriosis and possible treatment options.      She reports she started having abdominal wall pain about two summers ago. She was in [place] at that time and had a language barrier issue with medical care.  This continued and has seemed cyclical.  She sought evaluation earlier this year and ultimately had a CT 05/07/2018 with Dr. Lynne Leader, sports medicine.  She saw him as she wondered if this was related to a possible muscle injury (although she does not recall having any specific injurty).  Then an MRI was done 06/07/2018 showing 2.7 x 1.3cm abdominal wall mass that was consistent with endometriosis and less likely other peritoneal disease.  This has not been biopsied.  Dr Georgina Snell started pt on [place].  She is taking them continuous active.  She was switched due to side effects to Lo/ovral.  Reports she is having acne with the new OCP.  She is also having a lot of spotting with current OCP.  Pt does have underlying hypertension and is being treated with HCTZ.  BP with diastolic of 938 today.  She admits she is very nervous today.  She has been checking her BPs at home.  These are typically 130/90.   She saw Clovia Cuff for gyn consultation.  Felt appt was very rushed and didn't have questions answered.    Would like to know more information about endometriosis, how can it be in the abdominal wall, is it other places, does she need surgery, what options for treatment of pain and abdominal wall symptoms.  All of these questions were answered individually.  Advised pt to stop OCPs.  Should not be on combination OCPs with hypertension.  Could consider POPs which pt is interested in taking.  Side effects and risks discussed.  Possible need for biopsy for diagnostic purposes as well as possible resection of abdominal wall mass discussed.  Also medical treatment with Lupron or Orilissa discussed.  Will reach out to Dr.  Donne Hazel to see if he has removed lesions like this in the and to radiology to see if biopsy is possible.    Pt very appreciative of information and time today.  Patient's last menstrual period was 06/18/2018 (exact date).          Sexually active: Yes.    The current method of family planning is vasectomy.    Exercising: Yes.    Elliptical, weights, dance Smoker:  No  Health Maintenance: Pap:  06/28/18 neg. HR HPV:neg  History of abnormal Pap:  no MMG:  Never Colonoscopy: Never TDaP:  2019 Screening Labs: PCP   reports that she has never smoked. She has never used smokeless tobacco. She reports current alcohol use of about 12.0 - 14.0 standard drinks of alcohol per week. She reports that she does not use drugs.  Past Medical History:  Diagnosis Date  . Essential hypertension 06/22/2018  . Hypothyroid 05/20/2018  . Major depression in complete remission ([place]) 05/20/2018  . Preeclampsia     Past Surgical History:  Procedure Laterality Date  . WISDOM TOOTH EXTRACTION     38 yo    Current Outpatient Medications  Medication Sig Dispense Refill  . hydrochlorothiazide (HYDRODIURIL) 12.5 MG tablet TAKE 1 TABLET BY MOUTH  DAILY 90 tablet 3  . levothyroxine (SYNTHROID) 50 MCG tablet TAKE 1 TABLET BY MOUTH EVERY DAY ON EMPTY STOMACH IN THE MORNING 90 tablet 3  . norgestrel-ethinyl estradiol (  LO/OVRAL) 0.3-30 MG-MCG tablet Take 1 tablet by mouth daily. 1 Package 11  . sertraline (ZOLOFT) 50 MG tablet Take 1 tablet (50 mg total) by mouth daily. 90 tablet 3  . diclofenac sodium (VOLTAREN) 1 % GEL Apply 4 g topically 4 (four) times daily. To affected joint. (Patient not taking: Reported on 12/27/2018) 100 g 11   No current facility-administered medications for this visit.     Family History  Problem Relation Age of Onset  . Hypertension Mother   . Lung disease Mother   . Diabetes Paternal Grandfather     Review of Systems  Genitourinary: Positive for menstrual problem.        Irregular bleeding   All other systems reviewed and are negative.   Exam:   BP (!) 160/108   Pulse 84   Temp (!) 97.3 F (36.3 C) (Temporal)   Ht 5' 6.5" (1.689 m)   Wt 133 lb 12.8 oz (60.7 kg)   LMP 06/18/2018 (Exact Date)   BMI 21.27 kg/m    Height: 5' 6.5" (168.9 cm)  Ht Readings from Last 3 Encounters:  12/27/18 5' 6.5" (1.689 m)  06/28/18 5\' 6"  (1.676 m)  05/20/18 5\' 7"  (1.702 m)    General appearance: alert, cooperative and appears stated age No other physical exam performed   A:  Cyclical abdominal pain Probable abdominal wall mass that is endometriosis Hypertension  P:   Will need to communicate with general surgery to see if resection is possible and with radiology to see if biopsy is possible. Will switch to Micronor daily.  #82month supply/#3RFs for now Stop combination OCP today.  Pt voices clear understanding. Do not feel she needs additional diagnostic testing for additional locations of endometriosis as she has no other pelvic/abdominal pain symptoms.  ~30 minutes spent with patient >50% of time was in face to face discussion of above.

## 2018-12-28 ENCOUNTER — Other Ambulatory Visit: Payer: Self-pay | Admitting: Obstetrics & Gynecology

## 2018-12-28 DIAGNOSIS — N806 Endometriosis in cutaneous scar: Secondary | ICD-10-CM

## 2018-12-28 DIAGNOSIS — R222 Localized swelling, mass and lump, trunk: Secondary | ICD-10-CM

## 2019-01-27 ENCOUNTER — Other Ambulatory Visit: Payer: Self-pay

## 2019-01-27 DIAGNOSIS — Z20822 Contact with and (suspected) exposure to covid-19: Secondary | ICD-10-CM

## 2019-01-28 LAB — NOVEL CORONAVIRUS, NAA: SARS-CoV-2, NAA: NOT DETECTED

## 2019-03-17 ENCOUNTER — Other Ambulatory Visit: Payer: Self-pay | Admitting: Obstetrics & Gynecology

## 2019-03-17 NOTE — Telephone Encounter (Signed)
Medication refill request: micronor  Last AEX:  12-27-2018 SM  Next AEX: 07-05-20  Last MMG (if hormonal medication request): none Refill authorized: Today, please advise.   Medication pended for #84, 1RF. Please refill if appropriate.

## 2019-04-02 ENCOUNTER — Other Ambulatory Visit: Payer: Self-pay | Admitting: General Surgery

## 2019-04-05 ENCOUNTER — Other Ambulatory Visit: Payer: Self-pay

## 2019-04-05 ENCOUNTER — Encounter (HOSPITAL_BASED_OUTPATIENT_CLINIC_OR_DEPARTMENT_OTHER): Payer: Self-pay | Admitting: General Surgery

## 2019-04-07 ENCOUNTER — Encounter (HOSPITAL_BASED_OUTPATIENT_CLINIC_OR_DEPARTMENT_OTHER)
Admission: RE | Admit: 2019-04-07 | Discharge: 2019-04-07 | Disposition: A | Discharge status: Home / Self Care | Payer: 59 | Source: Ambulatory Visit | Attending: General Surgery | Admitting: General Surgery

## 2019-04-07 ENCOUNTER — Other Ambulatory Visit: Payer: Self-pay

## 2019-04-07 DIAGNOSIS — Z01812 Encounter for preprocedural laboratory examination: Secondary | ICD-10-CM | POA: Insufficient documentation

## 2019-04-07 DIAGNOSIS — I10 Essential (primary) hypertension: Secondary | ICD-10-CM | POA: Insufficient documentation

## 2019-04-07 LAB — BASIC METABOLIC PANEL
Anion gap: 6 (ref 5–15)
BUN: 10 mg/dL (ref 6–20)
CO2: 31 mmol/L (ref 22–32)
Calcium: 9.4 mg/dL (ref 8.9–10.3)
Chloride: 102 mmol/L (ref 98–111)
Creatinine, Ser: 0.67 mg/dL (ref 0.44–1.00)
GFR calc Af Amer: >60 mL/min (ref >60)
GFR calc non Af Amer: >60 mL/min (ref >60)
Glucose, Bld: 66 mg/dL — ABNORMAL LOW (ref 70–99)
Potassium: 3.9 mmol/L (ref 3.5–5.1)
Sodium: 139 mmol/L (ref 135–145)

## 2019-04-07 LAB — POCT PREGNANCY, URINE: Preg Test, Ur: NEGATIVE

## 2019-04-07 MED ORDER — ENSURE PRE-SURGERY PO LIQD: 296.0000 mL | Freq: Once | ORAL | Status: DC

## 2019-04-07 NOTE — Progress Notes (Signed)

## 2019-04-08 ENCOUNTER — Other Ambulatory Visit (HOSPITAL_COMMUNITY)
Admission: RE | Admit: 2019-04-08 | Discharge: 2019-04-08 | Disposition: A | Discharge status: Home / Self Care | Payer: 59 | Source: Ambulatory Visit | Attending: General Surgery | Admitting: General Surgery

## 2019-04-08 DIAGNOSIS — Z01812 Encounter for preprocedural laboratory examination: Secondary | ICD-10-CM | POA: Insufficient documentation

## 2019-04-08 DIAGNOSIS — Z20822 Contact with and (suspected) exposure to covid-19: Secondary | ICD-10-CM | POA: Insufficient documentation

## 2019-04-10 LAB — NOVEL CORONAVIRUS, NAA (HOSP ORDER, SEND-OUT TO REF LAB; TAT 18-24 HRS): SARS-CoV-2, NAA: NOT DETECTED

## 2019-04-11 NOTE — Anesthesia Preprocedure Evaluation (Addendum)
Anesthesia Evaluation  Patient identified by MRN, date of birth, ID band Patient awake    Reviewed: Allergy & Precautions, H&P , NPO status , Patient's Chart, lab work & pertinent test results  Airway Mallampati: I  TM Distance: >3 FB Neck ROM: Full    Dental no notable dental hx. (+) Teeth Intact, Dental Advisory Given   Pulmonary neg pulmonary ROS,    Pulmonary exam normal breath sounds clear to auscultation       Cardiovascular Exercise Tolerance: Good hypertension, Pt. on medications negative cardio ROS Normal cardiovascular exam Rhythm:Regular Rate:Normal     Neuro/Psych PSYCHIATRIC DISORDERS Depression negative neurological ROS  negative psych ROS   GI/Hepatic negative GI ROS, Neg liver ROS,   Endo/Other  negative endocrine ROSHypothyroidism   Renal/GU negative Renal ROS  negative genitourinary   Musculoskeletal negative musculoskeletal ROS (+)   Abdominal   Peds negative pediatric ROS (+)  Hematology negative hematology ROS (+)   Anesthesia Other Findings   Reproductive/Obstetrics negative OB ROS                           Anesthesia Physical Anesthesia Plan  ASA: II  Anesthesia Plan: General   Post-op Pain Management: GA combined w/ Regional for post-op pain   Induction: Intravenous  PONV Risk Score and Plan: 3 and Ondansetron, Dexamethasone and Treatment may vary due to age or medical condition  Airway Management Planned: Oral ETT  Additional Equipment:   Intra-op Plan:   Post-operative Plan: Extubation in OR  Informed Consent: I have reviewed the patients History and Physical, chart, labs and discussed the procedure including the risks, benefits and alternatives for the proposed anesthesia with the patient or authorized representative who has indicated his/her understanding and acceptance.     Dental advisory given  Plan Discussed with: Anesthesiologist, CRNA  and Surgeon  Anesthesia Plan Comments: (Discussed both nerve block for pain relief post-op and GA; including NV, sore throat, dental injury, and pulmonary complications)       Anesthesia Quick Evaluation

## 2019-04-12 ENCOUNTER — Encounter (HOSPITAL_BASED_OUTPATIENT_CLINIC_OR_DEPARTMENT_OTHER): Payer: Self-pay | Admitting: General Surgery

## 2019-04-12 ENCOUNTER — Encounter (HOSPITAL_BASED_OUTPATIENT_CLINIC_OR_DEPARTMENT_OTHER)
Admission: RE | Disposition: A | Discharge status: Home / Self Care | Payer: Self-pay | Source: Home / Self Care | Attending: General Surgery

## 2019-04-12 ENCOUNTER — Ambulatory Visit (HOSPITAL_BASED_OUTPATIENT_CLINIC_OR_DEPARTMENT_OTHER): Payer: 59 | Admitting: Anesthesiology

## 2019-04-12 ENCOUNTER — Ambulatory Visit (HOSPITAL_BASED_OUTPATIENT_CLINIC_OR_DEPARTMENT_OTHER)
Admission: RE | Admit: 2019-04-12 | Discharge: 2019-04-12 | Disposition: A | Discharge status: Home / Self Care | Payer: 59 | Attending: General Surgery | Admitting: General Surgery

## 2019-04-12 ENCOUNTER — Other Ambulatory Visit: Payer: Self-pay

## 2019-04-12 DIAGNOSIS — R1909 Other intra-abdominal and pelvic swelling, mass and lump: Secondary | ICD-10-CM | POA: Diagnosis not present

## 2019-04-12 DIAGNOSIS — E039 Hypothyroidism, unspecified: Secondary | ICD-10-CM | POA: Insufficient documentation

## 2019-04-12 DIAGNOSIS — Z79899 Other long term (current) drug therapy: Secondary | ICD-10-CM | POA: Insufficient documentation

## 2019-04-12 DIAGNOSIS — I10 Essential (primary) hypertension: Secondary | ICD-10-CM | POA: Insufficient documentation

## 2019-04-12 DIAGNOSIS — F329 Major depressive disorder, single episode, unspecified: Secondary | ICD-10-CM | POA: Insufficient documentation

## 2019-04-12 DIAGNOSIS — Z79890 Hormone replacement therapy (postmenopausal): Secondary | ICD-10-CM | POA: Insufficient documentation

## 2019-04-12 HISTORY — PX: EXCISION MASS ABDOMINAL: SHX6701

## 2019-04-12 SURGERY — EXCISION, MASS, TORSO
Anesthesia: General | Site: Abdomen | Laterality: Right

## 2019-04-12 MED ORDER — CEFAZOLIN SODIUM-DEXTROSE 2-4 GM/100ML-% IV SOLN
INTRAVENOUS | Status: AC
  Filled 2019-04-12: qty 100

## 2019-04-12 MED ORDER — PROPOFOL 10 MG/ML IV BOLUS
INTRAVENOUS | Status: DC | PRN
  Administered 2019-04-12: 200 mg via INTRAVENOUS

## 2019-04-12 MED ORDER — ONDANSETRON HCL 4 MG/2ML IJ SOLN
INTRAMUSCULAR | Status: DC | PRN
  Administered 2019-04-12: 4 mg via INTRAVENOUS

## 2019-04-12 MED ORDER — OXYCODONE HCL 5 MG/5ML PO SOLN: 5.0000 mg | Freq: Once | ORAL | Status: DC | PRN

## 2019-04-12 MED ORDER — ACETAMINOPHEN 160 MG/5ML PO SOLN: 325.0000 mg | ORAL | Status: DC | PRN

## 2019-04-12 MED ORDER — DEXAMETHASONE SODIUM PHOSPHATE 10 MG/ML IJ SOLN
INTRAMUSCULAR | Status: DC | PRN
  Administered 2019-04-12: 10 mg via INTRAVENOUS

## 2019-04-12 MED ORDER — ACETAMINOPHEN 325 MG PO TABS: 325.0000 mg | ORAL_TABLET | ORAL | Status: DC | PRN

## 2019-04-12 MED ORDER — FENTANYL CITRATE (PF) 100 MCG/2ML IJ SOLN
INTRAMUSCULAR | Status: DC | PRN
  Administered 2019-04-12: 100 ug via INTRAVENOUS

## 2019-04-12 MED ORDER — ACETAMINOPHEN 500 MG PO TABS
ORAL_TABLET | ORAL | Status: AC
  Filled 2019-04-12: qty 2

## 2019-04-12 MED ORDER — KETOROLAC TROMETHAMINE 15 MG/ML IJ SOLN
INTRAMUSCULAR | Status: AC
  Filled 2019-04-12: qty 1

## 2019-04-12 MED ORDER — FENTANYL CITRATE (PF) 100 MCG/2ML IJ SOLN
INTRAMUSCULAR | Status: AC
  Filled 2019-04-12: qty 2

## 2019-04-12 MED ORDER — GABAPENTIN 100 MG PO CAPS
ORAL_CAPSULE | ORAL | Status: AC
  Filled 2019-04-12: qty 1

## 2019-04-12 MED ORDER — FENTANYL CITRATE (PF) 100 MCG/2ML IJ SOLN
50.0000 ug | INTRAMUSCULAR | Status: DC | PRN
  Administered 2019-04-12: 07:00:00 100 ug via INTRAVENOUS

## 2019-04-12 MED ORDER — LACTATED RINGERS IV SOLN: INTRAVENOUS | Status: DC

## 2019-04-12 MED ORDER — ONDANSETRON HCL 4 MG/2ML IJ SOLN: 4.0000 mg | Freq: Once | INTRAMUSCULAR | Status: DC | PRN

## 2019-04-12 MED ORDER — KETOROLAC TROMETHAMINE 30 MG/ML IJ SOLN
INTRAMUSCULAR | Status: DC | PRN
  Administered 2019-04-12: 30 mg via INTRAVENOUS

## 2019-04-12 MED ORDER — LIDOCAINE 2% (20 MG/ML) 5 ML SYRINGE
INTRAMUSCULAR | Status: AC
  Filled 2019-04-12: qty 5

## 2019-04-12 MED ORDER — BUPIVACAINE HCL (PF) 0.25 % IJ SOLN
INTRAMUSCULAR | Status: AC
  Filled 2019-04-12: qty 30

## 2019-04-12 MED ORDER — ROCURONIUM BROMIDE 10 MG/ML (PF) SYRINGE
PREFILLED_SYRINGE | INTRAVENOUS | Status: AC
  Filled 2019-04-12: qty 10

## 2019-04-12 MED ORDER — PROPOFOL 10 MG/ML IV BOLUS
INTRAVENOUS | Status: AC
  Filled 2019-04-12: qty 20

## 2019-04-12 MED ORDER — OXYCODONE HCL 5 MG PO TABS: 5.0000 mg | ORAL_TABLET | Freq: Four times a day (QID) | ORAL | 0 refills | Status: DC | PRN

## 2019-04-12 MED ORDER — FENTANYL CITRATE (PF) 100 MCG/2ML IJ SOLN
25.0000 ug | INTRAMUSCULAR | Status: DC | PRN
  Administered 2019-04-12: 09:00:00 50 ug via INTRAVENOUS

## 2019-04-12 MED ORDER — GABAPENTIN 100 MG PO CAPS
100.0000 mg | ORAL_CAPSULE | ORAL | Status: AC
  Administered 2019-04-12: 100 mg via ORAL

## 2019-04-12 MED ORDER — MIDAZOLAM HCL 2 MG/2ML IJ SOLN
1.0000 mg | INTRAMUSCULAR | Status: DC | PRN
  Administered 2019-04-12: 2 mg via INTRAVENOUS

## 2019-04-12 MED ORDER — BUPIVACAINE HCL (PF) 0.5 % IJ SOLN
INTRAMUSCULAR | Status: AC
  Filled 2019-04-12: qty 30

## 2019-04-12 MED ORDER — ACETAMINOPHEN 500 MG PO TABS
1000.0000 mg | ORAL_TABLET | ORAL | Status: AC
  Administered 2019-04-12: 07:00:00 1000 mg via ORAL

## 2019-04-12 MED ORDER — BUPIVACAINE LIPOSOME 1.3 % IJ SUSP
INTRAMUSCULAR | Status: DC | PRN
  Administered 2019-04-12 (×2): 5 mL

## 2019-04-12 MED ORDER — MEPERIDINE HCL 25 MG/ML IJ SOLN: 6.2500 mg | INTRAMUSCULAR | Status: DC | PRN

## 2019-04-12 MED ORDER — MIDAZOLAM HCL 2 MG/2ML IJ SOLN
INTRAMUSCULAR | Status: AC
  Filled 2019-04-12: qty 2

## 2019-04-12 MED ORDER — BUPIVACAINE-EPINEPHRINE (PF) 0.25% -1:200000 IJ SOLN
INTRAMUSCULAR | Status: DC | PRN
  Administered 2019-04-12 (×2): 25 mL

## 2019-04-12 MED ORDER — SCOPOLAMINE 1 MG/3DAYS TD PT72
MEDICATED_PATCH | TRANSDERMAL | Status: AC
  Filled 2019-04-12: qty 1

## 2019-04-12 MED ORDER — CEFAZOLIN SODIUM-DEXTROSE 2-4 GM/100ML-% IV SOLN
2.0000 g | INTRAVENOUS | Status: AC
  Administered 2019-04-12: 08:00:00 2 g via INTRAVENOUS

## 2019-04-12 MED ORDER — KETOROLAC TROMETHAMINE 15 MG/ML IJ SOLN
15.0000 mg | INTRAMUSCULAR | Status: AC
  Administered 2019-04-12: 15 mg via INTRAVENOUS

## 2019-04-12 MED ORDER — OXYCODONE HCL 5 MG PO TABS: 5.0000 mg | ORAL_TABLET | Freq: Once | ORAL | Status: DC | PRN

## 2019-04-12 MED ORDER — SUGAMMADEX SODIUM 200 MG/2ML IV SOLN
INTRAVENOUS | Status: DC | PRN
  Administered 2019-04-12: 200 mg via INTRAVENOUS

## 2019-04-12 MED ORDER — SCOPOLAMINE 1 MG/3DAYS TD PT72
MEDICATED_PATCH | TRANSDERMAL | Status: DC | PRN
  Administered 2019-04-12: 1 via TRANSDERMAL

## 2019-04-12 SURGICAL SUPPLY — 34 items
BLADE CLIPPER SURG (BLADE) ×2 IMPLANT
BLADE SURG 15 STRL LF DISP TIS (BLADE) ×1 IMPLANT
BLADE SURG 15 STRL SS (BLADE) ×1
CHLORAPREP W/TINT 26 (MISCELLANEOUS) ×2 IMPLANT
COVER BACK TABLE 60X90IN (DRAPES) ×2 IMPLANT
COVER MAYO STAND STRL (DRAPES) ×2 IMPLANT
DERMABOND ADVANCED (GAUZE/BANDAGES/DRESSINGS) ×1
DERMABOND ADVANCED .7 DNX12 (GAUZE/BANDAGES/DRESSINGS) ×1 IMPLANT
DRAPE LAPAROTOMY 100X72 PEDS (DRAPES) ×2 IMPLANT
DRAPE UTILITY XL STRL (DRAPES) ×2 IMPLANT
ELECT COATED BLADE 2.86 ST (ELECTRODE) ×2 IMPLANT
ELECT REM PT RETURN 9FT ADLT (ELECTROSURGICAL) ×2
ELECTRODE REM PT RTRN 9FT ADLT (ELECTROSURGICAL) ×1 IMPLANT
GLOVE BIO SURGEON STRL SZ 6.5 (GLOVE) ×2 IMPLANT
GLOVE BIO SURGEON STRL SZ7 (GLOVE) ×2 IMPLANT
GLOVE BIOGEL PI IND STRL 7.0 (GLOVE) ×1 IMPLANT
GLOVE BIOGEL PI IND STRL 7.5 (GLOVE) ×1 IMPLANT
GLOVE BIOGEL PI INDICATOR 7.0 (GLOVE) ×1
GLOVE BIOGEL PI INDICATOR 7.5 (GLOVE) ×1
GOWN STRL REUS W/ TWL LRG LVL3 (GOWN DISPOSABLE) ×2 IMPLANT
GOWN STRL REUS W/TWL LRG LVL3 (GOWN DISPOSABLE) ×2
NEEDLE HYPO 22GX1.5 SAFETY (NEEDLE) ×2 IMPLANT
NS IRRIG 1000ML POUR BTL (IV SOLUTION) ×2 IMPLANT
PACK BASIN DAY SURGERY FS (CUSTOM PROCEDURE TRAY) ×2 IMPLANT
PENCIL SMOKE EVACUATOR (MISCELLANEOUS) ×2 IMPLANT
SLEEVE SCD COMPRESS KNEE MED (MISCELLANEOUS) ×2 IMPLANT
SPONGE LAP 4X18 RFD (DISPOSABLE) ×4 IMPLANT
STRIP CLOSURE SKIN 1/2X4 (GAUZE/BANDAGES/DRESSINGS) ×2 IMPLANT
SUT MNCRL AB 4-0 PS2 18 (SUTURE) ×2 IMPLANT
SUT NOVA NAB DX-16 0-1 5-0 T12 (SUTURE) ×4 IMPLANT
SUT VIC AB 3-0 SH 27 (SUTURE) ×1
SUT VIC AB 3-0 SH 27X BRD (SUTURE) ×1 IMPLANT
SYR CONTROL 10ML LL (SYRINGE) ×2 IMPLANT
TOWEL GREEN STERILE FF (TOWEL DISPOSABLE) ×2 IMPLANT

## 2019-04-12 NOTE — Transfer of Care (Signed)
Immediate Anesthesia Transfer of Care Note  Patient: Cheryl Christian  Procedure(s) Performed: EXCISION ABDOMINAL WALL MASS (Right Abdomen)  Patient Location: PACU  Anesthesia Type:GA combined with regional for post-op pain  Level of Consciousness: awake, alert  and oriented  Airway & Oxygen Therapy: Patient Spontanous Breathing and Patient connected to face mask oxygen  Post-op Assessment: Report given to RN and Post -op Vital signs reviewed and stable  Post vital signs: Reviewed and stable  Last Vitals:  Vitals Value Taken Time  BP 119/96 04/12/19 0840  Temp    Pulse 113 04/12/19 0842  Resp 17 04/12/19 0842  SpO2 95 % 04/12/19 0842  Vitals shown include unvalidated device data.  Last Pain:  Vitals:   04/12/19 0647  TempSrc: Oral  PainSc: 0-No pain      Patients Stated Pain Goal: 1 (04/12/19 7014)  Complications: No apparent anesthesia complications

## 2019-04-12 NOTE — H&P (Signed)
Cheryl Christian is an 39 y.o. female.   Chief Complaint: abdominal wall mass HPI: 36 yof referred by Dr Hyacinth Meeker for abdominal wall mass that I saw a couple months ago. she began having abdominal wall pain about 2 years ago. she was seen in [place] and tried nsaids. it continued. it is cyclical. she has been seen by sports medicine. she has ct scan and then underwent an mri. this shows a subtle plaquelike area along posterior right rectus that measures 1.9x0.8 cm in size. this is similar to ct scan as well. the liver is normal. this area was palpable before but she cannot feel it anymore. she is on ocp's now and states the pain and symptoms are better. I had her return today for a recheck .since that visit this area is bigger to her and much more symptomatic  Past Medical History:  Diagnosis Date  . Essential hypertension 06/22/2018  . Hypothyroid 05/20/2018  . 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 Preeclampsia     Past Surgical History:  Procedure Laterality Date  . WISDOM TOOTH EXTRACTION     39 yo    Family History  Problem Relation Age of Onset  . Hypertension Mother   . Lung disease Mother        lymphangioleimyomatosis ([facility]), s/p transplant  . Diabetes Paternal Grandfather    Social History:  reports that she has never smoked. She has never used smokeless tobacco. She reports current alcohol use of about 12.0 - 14.0 standard drinks of alcohol per week. She reports that she does not use drugs.  Allergies: No Known Allergies  Medications Prior to Admission  Medication Sig Dispense Refill  . hydrochlorothiazide (HYDRODIURIL) 12.5 MG tablet TAKE 1 TABLET BY MOUTH  DAILY 90 tablet 3  . ibuprofen (ADVIL) 400 MG tablet Take 400 mg by mouth every 6 (six) hours as needed for headache.    . levothyroxine (SYNTHROID) 50 MCG tablet TAKE 1 TABLET BY MOUTH EVERY DAY ON EMPTY STOMACH IN THE MORNING 90 tablet 3  . norethindrone (MICRONOR) 0.35 MG tablet TAKE 1 TABLET BY  MOUTH EVERY DAY 84 tablet 1  . sertraline (ZOLOFT) 50 MG tablet Take 1 tablet (50 mg total) by mouth daily. 90 tablet 3    No results found for this or any previous visit (from the past 48 hour(s)). No results found.  Review of Systems  All other systems reviewed and are negative.   Pulse 81, temperature (!) 97.1 F (36.2 C), temperature source Oral, resp. rate 20, height 5' 6.5" (1.689 m), weight 61.2 kg, last menstrual period 03/25/2019, SpO2 100 %. Physical Exam  cv rrr pulm clear bilaterally Abdomen Note: soft tender mass just to right of umbilicus and somewhat superior, in rectus  Assessment/Plan ABDOMINAL WALL MASS (R22.2) Story: discussed continued observation vs excision I can feel this now and easily apparent. she would like to consider excision. I discussed excision of this mass with possible abdominal wall repair/reconstruction even with mesh if indicated. she understands risk of recurrence, hernia, infection, bleeding, pain postop. discussed covid testing preop and timing of surgery  Emelia Loron, MD 04/12/2019, 7:05 AM

## 2019-04-12 NOTE — Anesthesia Procedure Notes (Signed)
Anesthesia Regional Block: TAP block   Pre-Anesthetic Checklist: ,, timeout performed, Correct Patient, Correct Site, Correct Laterality, Correct Procedure, Correct Position, site marked, Risks and benefits discussed,  Surgical consent,  Pre-op evaluation,  At surgeon's request and post-op pain management  Laterality: Left and Right  Prep: chloraprep       Needles:  Injection technique: Single-shot  Needle Type: Echogenic Stimulator Needle     Needle Length: 5cm  Needle Gauge: 22     Additional Needles:   Procedures:, nerve stimulator,,, ultrasound used (permanent image in chart),,,,  Narrative:  Start time: 04/12/2019 7:10 AM End time: 04/12/2019 7:20 AM Injection made incrementally with aspirations every 5 mL.  Performed by: Personally  Anesthesiologist: Bethena Midget, MD  Additional Notes: Functioning IV was confirmed and monitors were applied.  A 44mm 22ga Arrow echogenic stimulator needle was used. Sterile prep and drape,hand hygiene and sterile gloves were used. Ultrasound guidance: relevant anatomy identified, needle position confirmed, local anesthetic spread visualized around nerve(s)., vascular puncture avoided.  Image printed for medical record. Negative aspiration and negative test dose prior to incremental administration of local anesthetic. The patient tolerated the procedure well.

## 2019-04-12 NOTE — Op Note (Signed)
Preoperative diagnosis: abdominal wall mass Postoperative diagnosis: abdominal wall mass Procedure: Excision of posterior rectus sheath mass, primary repair of hernia defect Surgeon: Dr. Harden Mo Estimated blood loss: Minimal Complications: None Drains: None Specimens: Abdominal wall mass to pathology Sponge needle count was correct at completion Anesthesia: General with a tap block on the right side Disposition to recovery in stable condition  Indications: This a 39 year old female whose had a symptomatic right abdominal wall mass for some time.  This is worse with her cycle.  She has had imaging that show a vague mass present.  I was finally able to palpate this on her exam discussed excision due to her continued presence of symptoms.  Procedure: After informed consent was obtained the patient was given a tap block by anesthesia.  She was given antibiotics.  SCDs were in place.  She was then placed under general anesthesia without complication.  She was prepped and draped in the standard sterile surgical fashion.  A surgical timeout was then performed.  She and I located the mass before surgery.  I then made a transverse incision overlying this.  I used cautery to dissect down to the external oblique.  I incised the external oblique and spread the rectus muscle.  The mass was easily palpable.  The mass was adherent to the posterior rectus sheath that is it appeared on the imaging.  I then excised the mass as well as the posterior rectus sheath and peritoneum.  This was then inside the abdomen.  I passed this off the table.  Clinically this appeared to be consistent with an endometrioma.  I then closed the peritoneum in the posterior portion #1 Novafil's interrupted.  I then closed the external oblique with #1 Novafil's.  I then closed the subcuticular tissue with 3-0 Vicryl and the skin with 4-0 Monocryl.  Glue was placed.  She tolerated this well was extubated transferred to recovery  stable.

## 2019-04-12 NOTE — Interval H&P Note (Signed)
History and Physical Interval Note:  04/12/2019 7:07 AM  Cheryl Christian  has presented today for surgery, with the diagnosis of ABDOMINAL WALL MASS.  The various methods of treatment have been discussed with the patient and family. After consideration of risks, benefits and other options for treatment, the patient has consented to  Procedure(s) with comments: EXCISION ABDOMINAL WALL MASS, POSSIBLE MESH (N/A) - GENERAL AND TAP BLOCK ANESTHESIA as a surgical intervention.  [name] history has been reviewed, patient examined, no change in status, stable for surgery.  I have reviewed the patient's chart and labs.  Questions were answered to the patient's satisfaction.     Emelia Loron

## 2019-04-12 NOTE — Discharge Instructions (Signed)
[facility]  POST OP INSTRUCTIONS Take 400 mg ibuprofen every 8 hours for next 72 hours and use ice several times daily. Use oxycodone for pain not relieved by ibuprofen and ice.  Always review your discharge instruction sheet given to you by the facility where your surgery was performed. IF YOU HAVE DISABILITY OR FAMILY LEAVE FORMS, YOU MUST BRING THEM TO THE OFFICE FOR PROCESSING.   DO NOT GIVE THEM TO YOUR DOCTOR.  1. A prescription for pain medication may be given to you upon discharge.  Take your pain medication as prescribed, if needed.  If narcotic pain medicine is not needed, then you may take acetaminophen (Tylenol), naprosyn (Alleve), or ibuprofen (Advil) as needed. 2. Take your usually prescribed medications unless otherwise directed. 3. If you need a refill on your pain medication, please contact your pharmacy.  They will contact our office to request authorization. Prescriptions will not be filled after 5pm or on week-ends. 4. You should follow a light diet the first few days after arrival home, such as soup and crackers, etc.  Be sure to include lots of fluids daily. 5. Most patients will experience some swelling and bruising in the area of the incisions.  Ice packs will help.  Swelling and bruising can take several days to resolve.  6. It is common to experience some constipation if taking pain medication after surgery.  Increasing fluid intake and taking a stool softener (such as Colace) will usually help or prevent this problem from occurring.  A mild laxative (Milk of Magnesia or Miralax) should be taken according to package instructions if there are no bowel movements after 48 hours. 7. Unless discharge instructions indicate otherwise, you may remove your bandages 48 hours after surgery, and you may shower at that time.  You may have steri-strips (small skin tapes) in place directly over the incision.  These strips should be left  on the skin for 7-10 days.  If your surgeon used skin glue on the incision, you may shower in 24 hours.  The glue will flake off over the next 2-3 weeks.  Any sutures or staples will be removed at the office during your follow-up visit. 8. ACTIVITIES:  You may resume regular (light) daily activities beginning the next day--such as daily self-care, walking, climbing stairs--gradually increasing activities as tolerated.  You may have sexual intercourse when it is comfortable.  Refrain from any heavy lifting or straining until approved by your doctor. a. You may drive when you are no longer taking prescription pain medication, you can comfortably wear a seatbelt, and you can safely maneuver your car and apply brakes. b. RETURN TO WORK:  __________________________________________________________ 9. You should see your doctor in the office for a follow-up appointment approximately 2-3 weeks after your surgery.  Make sure that you call for this appointment within a day or two after you arrive home to insure a convenient appointment time. 10. OTHER INSTRUCTIONS: __________________________________________________________________________________________________________________________ __________________________________________________________________________________________________________________________ WHEN TO CALL YOUR DOCTOR: 1. Fever over 101.0 2. Inability to urinate 3. Continued bleeding from incision. 4. Increased pain, redness, or drainage from the incision. 5. Increasing abdominal pain  The clinic staff is available to answer your questions during regular business hours.  Please don't hesitate to call and ask to speak to one of the nurses for clinical concerns.  If you have a medical emergency, go to the nearest emergency room or call 911.  A surgeon from [facility] is always on call at the hospital.  [address], [place], [place], [place]  [address] ? [address],  [place], [place]   [address] [phone] ? [phone] ? FAX [phone] Web site: [email or web address]    Post Anesthesia Home Care Instructions  Activity: Get plenty of rest for the remainder of the day. A responsible individual must stay with you for 24 hours following the procedure.  For the next 24 hours, DO NOT: -Drive a car -Paediatric nurse -Drink alcoholic beverages -Take any medication unless instructed by your physician -Make any legal decisions or sign important papers.  Meals: Start with liquid foods such as gelatin or soup. Progress to regular foods as tolerated. Avoid greasy, spicy, heavy foods. If nausea and/or vomiting occur, drink only clear liquids until the nausea and/or vomiting subsides. Call your physician if vomiting continues.  Special Instructions/Symptoms: Your throat may feel dry or sore from the anesthesia or the breathing tube placed in your throat during surgery. If this causes discomfort, gargle with warm salt water. The discomfort should disappear within 24 hours.  If you had a scopolamine patch placed behind your ear for the management of post- operative nausea and/or vomiting:  1. The medication in the patch is effective for 72 hours, after which it should be removed.  Wrap patch in a tissue and discard in the trash. Wash hands thoroughly with soap and water. 2. You may remove the patch earlier than 72 hours if you experience unpleasant side effects which may include dry mouth, dizziness or visual disturbances. 3. Avoid touching the patch. Wash your hands with soap and water after contact with the patch.  No tylenol until after 1pm today.  No ibuprofen until after 3pm today.  Regional Anesthesia Blocks  1. Numbness or the inability to move the "blocked" extremity may last from 3-48 hours after placement. The length of time depends on the medication injected and your individual response to the medication. If the numbness is not going  away after 48 hours, call your surgeon.  2. The extremity that is blocked will need to be protected until the numbness is gone and the  Strength has returned. Because you cannot feel it, you will need to take extra care to avoid injury. Because it may be weak, you may have difficulty moving it or using it. You may not know what position it is in without looking at it while the block is in effect.  3. For blocks in the legs and feet, returning to weight bearing and walking needs to be done carefully. You will need to wait until the numbness is entirely gone and the strength has returned. You should be able to move your leg and foot normally before you try and bear weight or walk. You will need someone to be with you when you first try to ensure you do not fall and possibly risk injury.  4. Bruising and tenderness at the needle site are common side effects and will resolve in a few days.  5. Persistent numbness or new problems with movement should be communicated to the surgeon or the [facility] [phone] [place] [phone]).  Information for Discharge Teaching: EXPAREL (bupivacaine liposome injectable suspension)   Your surgeon or anesthesiologist gave you EXPAREL(bupivacaine) to help control your pain after surgery.   EXPAREL is a local anesthetic that provides pain relief by numbing the tissue around the surgical site.  EXPAREL is designed to release pain medication over time and can control pain for up to 72  hours.  Depending on how you respond to EXPAREL, you may require less pain medication during your recovery.  Possible side effects:  Temporary loss of sensation or ability to move in the area where bupivacaine was injected.  Nausea, vomiting, constipation  Rarely, numbness and tingling in your mouth or lips, lightheadedness, or anxiety may occur.  Call your doctor right away if you think you may be experiencing any of these sensations, or if  you have other questions regarding possible side effects.  Follow all other discharge instructions given to you by your surgeon or nurse. Eat a healthy diet and drink plenty of water or other fluids.  If you return to the hospital for any reason within 96 hours following the administration of EXPAREL, it is important for health care providers to know that you have received this anesthetic. A teal colored band has been placed on your arm with the date, time and amount of EXPAREL you have received in order to alert and inform your health care providers. Please leave this armband in place for the full 96 hours following administration, and then you may remove the band.

## 2019-04-12 NOTE — Anesthesia Procedure Notes (Signed)
Procedure Name: Intubation Date/Time: 04/12/2019 7:38 AM Performed by: [place] Danikah Budzik C, CRNA Pre-anesthesia Checklist: Patient identified, Emergency Drugs available, Suction available and Patient being monitored Patient Re-evaluated:Patient Re-evaluated prior to induction Oxygen Delivery Method: Circle system utilized Preoxygenation: Pre-oxygenation with 100% oxygen Induction Type: IV induction Ventilation: Mask ventilation without difficulty Laryngoscope Size: Mac and 3 Grade View: Grade I Tube type: Oral Tube size: 7.0 mm Number of attempts: 1 Airway Equipment and Method: Stylet and Oral airway Placement Confirmation: ETT inserted through vocal cords under direct vision,  positive ETCO2 and breath sounds checked- equal and bilateral Secured at: 20 cm Tube secured with: Tape Dental Injury: Teeth and Oropharynx as per pre-operative assessment

## 2019-04-12 NOTE — Anesthesia Postprocedure Evaluation (Signed)
Anesthesia Post Note  Patient: Saydee Zolman  Procedure(s) Performed: EXCISION ABDOMINAL WALL MASS (Right Abdomen)     Patient location during evaluation: PACU Anesthesia Type: General Level of consciousness: awake and alert Pain management: pain level controlled Vital Signs Assessment: post-procedure vital signs reviewed and stable Respiratory status: spontaneous breathing, nonlabored ventilation, respiratory function stable and patient connected to nasal cannula oxygen Cardiovascular status: blood pressure returned to baseline and stable Postop Assessment: no apparent nausea or vomiting Anesthetic complications: no    Last Vitals:  Vitals:   04/12/19 0915 04/12/19 0930  BP: (!) 147/89 (!) 142/99  Pulse: 87 93  Resp: 17 18  Temp:  37 C  SpO2: 100% 100%    Last Pain:  Vitals:   04/12/19 0930  TempSrc:   PainSc: 1                  Judithe Keetch

## 2019-04-12 NOTE — Progress Notes (Signed)
Assisted Dr. Tacy Dura with right and left, ultrasound guided, transabdominal plane block. Side rails up, monitors on throughout procedure. See vital signs in flow sheet. Tolerated Procedure well.

## 2019-04-13 ENCOUNTER — Encounter: Payer: Self-pay | Admitting: *Deleted

## 2019-04-13 LAB — SURGICAL PATHOLOGY

## 2019-04-14 ENCOUNTER — Encounter: Payer: Self-pay | Admitting: Family Medicine

## 2019-04-14 NOTE — Progress Notes (Unsigned)
I see that you finally had that mass removed from your abdomen.  Pathology looks to be endometriosis.   Are you feeling?  Cheryl Christian

## 2019-04-22 ENCOUNTER — Telehealth: Payer: Self-pay | Admitting: Obstetrics & Gynecology

## 2019-04-22 NOTE — Telephone Encounter (Signed)
Patient has a medication question. 

## 2019-04-22 NOTE — Telephone Encounter (Signed)
Spoke with patient.   1. Patient reports hx of endometriosis, s/p excision of abdominal wall mass on 04/12/19. Patient reports she is doing well after, no complaints of pain. Would like to discuss if she should continue POP.   2. Patient reports "low mood" 2-7 days prior to menses starting, resolves immediately after menses starts. Would like to discuss treatment options. Denies SI/HI, feels safe. Has regular menses with POP.   OV scheduled for 04/26/19 at 9:30am with Dr.Miller.   Routing to provider for final review. Patient is agreeable to disposition. Will close encounter.

## 2019-04-26 ENCOUNTER — Encounter: Payer: Self-pay | Admitting: Obstetrics & Gynecology

## 2019-04-26 ENCOUNTER — Ambulatory Visit (INDEPENDENT_AMBULATORY_CARE_PROVIDER_SITE_OTHER): Payer: 59 | Admitting: Obstetrics & Gynecology

## 2019-04-26 ENCOUNTER — Other Ambulatory Visit: Payer: Self-pay

## 2019-04-26 VITALS — BP 150/88 | HR 84 | Temp 97.5°F | Ht 66.0 in | Wt 134.0 lb

## 2019-04-26 DIAGNOSIS — R4586 Emotional lability: Secondary | ICD-10-CM | POA: Diagnosis not present

## 2019-04-26 MED ORDER — SERTRALINE HCL 100 MG PO TABS: 100.0000 mg | ORAL_TABLET | Freq: Every day | ORAL | 3 refills | Status: DC

## 2019-04-26 NOTE — Progress Notes (Signed)
GYNECOLOGY  VISIT  CC:   Patient complains of having "really bad" PMS. Per patient, had depression and "crankiness". Patient also would like to discuss if she still needs to take Micronor.   HPI: 39 y.o. G15P2012 Married White or Caucasian female here for PMS symptoms.  She is on micronor and typically doesn't have a cycle.  The did cycle last week that was fairly normal.  She is experiencing more mood changes that have worsened in the past year.  When she has the mood change is depressed and sensation of darkness which is very scary for her.  Denies suicidal ideations when this occurs.  She doesn't want it to worsen and to resolve if possible.  These last about 12-48 hours.  It is not predictable but she does feel it is related to menstrual cycle.    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 Donne Hazel.  This area is feeling much better and she didn't have the typical pain with menstrual bleeding that she normally has.    GYNECOLOGIC HISTORY: Patient's last menstrual period was 04/19/2019. Contraception: Micronor and Vasectomy Menopausal hormone therapy: n/a  Patient Active Problem List   Diagnosis Date Noted  . Essential hypertension 06/22/2018  . 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 Hypothyroid 05/20/2018  . Abdominal wall mass 05/20/2018    Past Medical History:  Diagnosis Date  . Essential hypertension 06/22/2018  . Hypothyroid 05/20/2018  . Major depression in complete remission ([place]) 05/20/2018  . Preeclampsia     Past Surgical History:  Procedure Laterality Date  . EXCISION MASS ABDOMINAL Right 04/12/2019   Procedure: EXCISION ABDOMINAL WALL MASS;  Surgeon: Rolm Bookbinder, MD;  Location: [facility];  Service: General;  Laterality: Right;  GENERAL AND TAP BLOCK ANESTHESIA  . WISDOM TOOTH EXTRACTION     39 yo    MEDS:   Current Outpatient Medications on File Prior to Visit  Medication Sig Dispense Refill  .  hydrochlorothiazide (HYDRODIURIL) 12.5 MG tablet TAKE 1 TABLET BY MOUTH  DAILY 90 tablet 3  . ibuprofen (ADVIL) 400 MG tablet Take 400 mg by mouth every 6 (six) hours as needed for headache.    . levothyroxine (SYNTHROID) 50 MCG tablet TAKE 1 TABLET BY MOUTH EVERY DAY ON EMPTY STOMACH IN THE MORNING 90 tablet 3  . norethindrone (MICRONOR) 0.35 MG tablet TAKE 1 TABLET BY MOUTH EVERY DAY 84 tablet 1  . sertraline (ZOLOFT) 50 MG tablet Take 1 tablet (50 mg total) by mouth daily. 90 tablet 3   No current facility-administered medications on file prior to visit.    ALLERGIES: Patient has no known allergies.  Family History  Problem Relation Age of Onset  . Hypertension Mother   . Lung disease Mother        lymphangioleimyomatosis (LAMB), s/p transplant  . Diabetes Paternal Grandfather     SH:  Married, non smoker  Review of Systems  All other systems reviewed and are negative.   PHYSICAL EXAMINATION:    BP (!) 150/88 (BP Location: Right Arm, Patient Position: Sitting, Cuff Size: Normal)   Pulse 84   Temp (!) 97.5 F (36.4 C) (Temporal)   Ht 5\' 6"  (1.676 m)   Wt 134 lb (60.8 kg)   LMP 04/19/2019   BMI 21.63 kg/m     General appearance: alert, cooperative and appears stated age Inc:  C/D/I  Assessment: Abdominal wall endometriosis Mood change that do seem cycle related Hypertension (exacerbated by OV today).  She checks at home. H/o depression    Plan: Increase Zoloft to 100mg  daily.  Rx to pharmacy. Will plan to stop micronor. Plan to follow up in one month.   ~20 minutes spent with patient >50% of time was in face to face discussion of above.

## 2019-06-23 ENCOUNTER — Telehealth: Payer: Self-pay | Admitting: *Deleted

## 2019-06-23 ENCOUNTER — Encounter: Payer: Self-pay | Admitting: Obstetrics & Gynecology

## 2019-06-23 NOTE — Telephone Encounter (Signed)
See telephone encounter dated 06/23/19.   Encounter closed.  

## 2019-06-23 NOTE — Telephone Encounter (Signed)
MyChart message to patient.   Patient has refills of zoloft on file.   Routing to Dr. Gloris Ham.   Encounter closed.

## 2019-06-23 NOTE — Telephone Encounter (Signed)
[name]  Phone Number: [phone]  Dear Dr. Hyacinth Meeker,   I hope you're well! This email is to let you know that my PMDD (or what-have-you) symptoms have been much milder for the last two cycles, perhaps because of the higher sertraline dose we decided to try. I haven't noticed any side effects of it either. My mood has felt much better recently in general, not just preceding my period. It's hard to pinpoint cause and effect, but so far so good!   Thank you very much for your help. It makes such a huge difference.   sincerely,  Cheryl Christian

## 2019-10-01 ENCOUNTER — Other Ambulatory Visit: Payer: Self-pay | Admitting: Family Medicine

## 2019-10-13 ENCOUNTER — Other Ambulatory Visit: Payer: Self-pay | Admitting: Family Medicine

## 2019-10-17 ENCOUNTER — Other Ambulatory Visit: Payer: Self-pay | Admitting: Family Medicine

## 2019-10-29 ENCOUNTER — Other Ambulatory Visit: Payer: Self-pay | Admitting: Family Medicine

## 2019-11-11 ENCOUNTER — Ambulatory Visit (INDEPENDENT_AMBULATORY_CARE_PROVIDER_SITE_OTHER): Payer: 59 | Admitting: Primary Care

## 2019-11-11 ENCOUNTER — Other Ambulatory Visit: Payer: Self-pay

## 2019-11-11 ENCOUNTER — Encounter (INDEPENDENT_AMBULATORY_CARE_PROVIDER_SITE_OTHER): Payer: Self-pay | Admitting: Primary Care

## 2019-11-11 VITALS — BP 140/90 | HR 96 | Ht 66.0 in | Wt 131.0 lb

## 2019-11-11 DIAGNOSIS — Z7689 Persons encountering health services in other specified circumstances: Secondary | ICD-10-CM

## 2019-11-11 DIAGNOSIS — Z13220 Encounter for screening for lipoid disorders: Secondary | ICD-10-CM

## 2019-11-11 DIAGNOSIS — I10 Essential (primary) hypertension: Secondary | ICD-10-CM | POA: Diagnosis not present

## 2019-11-11 DIAGNOSIS — R519 Headache, unspecified: Secondary | ICD-10-CM

## 2019-11-11 DIAGNOSIS — E039 Hypothyroidism, unspecified: Secondary | ICD-10-CM

## 2019-11-11 DIAGNOSIS — B070 Plantar wart: Secondary | ICD-10-CM

## 2019-11-11 MED ORDER — LOSARTAN POTASSIUM 100 MG PO TABS: 100.0000 mg | ORAL_TABLET | Freq: Every day | ORAL | 0 refills | Status: DC

## 2019-11-11 MED ORDER — LOSARTAN POTASSIUM 100 MG PO TABS: 100.0000 mg | ORAL_TABLET | Freq: Every day | ORAL | 3 refills | Status: DC

## 2019-11-11 MED ORDER — LOSARTAN POTASSIUM-HCTZ 100-25 MG PO TABS: 1.0000 | ORAL_TABLET | Freq: Every day | ORAL | 3 refills | Status: DC

## 2019-11-11 NOTE — Progress Notes (Signed)
New Patient Office Visit  Subjective:  Patient ID: Cheryl Christian, female    DOB: 03-08-1981  Age: 39 y.o. MRN: [id number]  CC:  Chief Complaint  Patient presents with  . New Patient (Initial Visit)    HPI Cheryl Christian is establishing care and major concern is high blood pressure . HTN runs in the family she eats health and exercises. Takes Bp at home and hovers over systolic 128-147 and diastolic 82-93. When Bp > 140/90 she has a head ache. Denies shortness of breath, chest pain or lower extremity edema.  Past Medical History:  Diagnosis Date  . Essential hypertension 06/22/2018  . Hypothyroid 05/20/2018  . 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 Preeclampsia     Past Surgical History:  Procedure Laterality Date  . EXCISION MASS ABDOMINAL Right 04/12/2019   Procedure: EXCISION ABDOMINAL WALL MASS;  Surgeon: Emelia Loron, MD;  Location: [facility];  Service: General;  Laterality: Right;  GENERAL AND TAP BLOCK ANESTHESIA  . WISDOM TOOTH EXTRACTION     39 yo    Family History  Problem Relation Age of Onset  . Hypertension Mother   . Lung disease Mother        lymphangioleimyomatosis (LAMB), s/p transplant  . Diabetes Paternal Grandfather     Social History   Socioeconomic History  . Marital status: Married    Spouse name: Not on file  . Number of children: Not on file  . Years of education: Not on file  . Highest education level: Not on file  Occupational History  . Not on file  Tobacco Use  . Smoking status: Never Smoker  . Smokeless tobacco: Never Used  Vaping Use  . Vaping Use: Never used  Substance and Sexual Activity  . Alcohol use: Yes    Alcohol/week: 12.0 - 14.0 standard drinks    Types: 12 - 14 Standard drinks or equivalent per week  . Drug use: Never  . Sexual activity: Yes    Birth control/protection: Surgical    Comment: Husband has vasectomy  Other Topics Concern  . Not on file  Social History Narrative   . Not on file   Social Determinants of Health   Financial Resource Strain:   . Difficulty of Paying Living Expenses:   Food Insecurity:   . Worried About Programme researcher, broadcasting/film/video in the Last Year:   . Barista in the Last Year:   Transportation Needs:   . Freight forwarder (Medical):   Marland Kitchen Lack of Transportation (Non-Medical):   Physical Activity:   . Days of Exercise per Week:   . Minutes of Exercise per Session:   Stress:   . Feeling of Stress :   Social Connections:   . Frequency of Communication with Friends and Family:   . Frequency of Social Gatherings with Friends and Family:   . Attends Religious Services:   . Active Member of Clubs or Organizations:   . Attends Banker Meetings:   Marland Kitchen Marital Status:   Intimate Partner Violence:   . Fear of Current or Ex-Partner:   . Emotionally Abused:   Marland Kitchen Physically Abused:   . Sexually Abused:     ROS Review of Systems  Neurological: Positive for headaches.       Elevated bp   All other systems reviewed and are negative.   Objective:   Today's Vitals: BP 140/90   Pulse 96   Ht 5\' 6"  (1.676  m)   Wt 131 lb (59.4 kg)   SpO2 98%   BMI 21.14 kg/m   Physical Exam Vitals reviewed.  Constitutional:      Appearance: Normal appearance. She is normal weight.  HENT:     Head: Normocephalic.     Right Ear: Tympanic membrane normal.     Left Ear: Tympanic membrane normal.     Nose: Nose normal.  Eyes:     Extraocular Movements: Extraocular movements intact.     Pupils: Pupils are equal, round, and reactive to light.  Cardiovascular:     Rate and Rhythm: Normal rate and regular rhythm.     Pulses: Normal pulses.     Heart sounds: Normal heart sounds.  Pulmonary:     Effort: Pulmonary effort is normal.     Breath sounds: Normal breath sounds.  Abdominal:     General: Abdomen is flat. Bowel sounds are normal.  Musculoskeletal:        General: Normal range of motion.     Cervical back: Normal range of  motion.  Skin:    General: Skin is warm and dry.  Neurological:     Mental Status: She is alert and oriented to person, place, and time.  Psychiatric:        Mood and Affect: Mood normal.        Behavior: Behavior normal.        Thought Content: Thought content normal.        Judgment: Judgment normal.     Assessment & Plan:  Cheryl Christian was seen today for new patient (initial visit).  Diagnoses and all orders for this visit:  Encounter to establish care Cheryl Passe, NP-C will be your  (PCP) she is mastered prepared . She is skilled to diagnosed and treat illness. Also able to answer health concern as well as continuing care of varied medical conditions, not limited by cause, organ system, or diagnosis.   Essential hypertension Counseled on blood pressure goal of less than 130/80, low-sodium, DASH diet, medication compliance, 150 minutes of moderate intensity exercise per week.  Intractable episodic headache, unspecified headache type  Acquired hypothyroidism Hypothyroidism can cause slow metabolism, fatigue, weight gain, dry hair/skin, constipation, swelling, decreased memory/brain fog. We are going to start you on a new medication for thyroid. Please take it on an empty stomach 25mins-1 hour before food to have the most effective absorption. CURRENTLY ON levothyroxine 50 Mcg. Recheck lab only in one month.   Outpatient Encounter Medications as of 11/11/2019  Medication Sig  . hydrochlorothiazide (HYDRODIURIL) 12.5 MG tablet TAKE 1 TABLET BY MOUTH  DAILY  . ibuprofen (ADVIL) 400 MG tablet Take 400 mg by mouth every 6 (six) hours as needed for headache.  . levothyroxine (SYNTHROID) 50 MCG tablet TAKE 1 TABLET BY MOUTH EVERY DAY ON EMPTY STOMACH IN THE MORNING  . sertraline (ZOLOFT) 100 MG tablet Take 1 tablet (100 mg total) by mouth daily.  Marland Kitchen losartan (COZAAR) 100 MG tablet Take 1 tablet (100 mg total) by mouth daily.  Marland Kitchen losartan-hydrochlorothiazide (HYZAAR) 100-25 MG tablet Take 1  tablet by mouth daily.  . norethindrone (MICRONOR) 0.35 MG tablet TAKE 1 TABLET BY MOUTH EVERY DAY  . [DISCONTINUED] losartan (COZAAR) 100 MG tablet Take 1 tablet (100 mg total) by mouth daily.  . [DISCONTINUED] losartan-hydrochlorothiazide (HYZAAR) 100-25 MG tablet Take 1 tablet by mouth daily.   No facility-administered encounter medications on file as of 11/11/2019.    Follow-up: Return in about 6 weeks (around  12/23/2019) for labs and Bp eval .   Grayce Sessions, NP

## 2019-11-11 NOTE — Patient Instructions (Signed)

## 2019-11-11 NOTE — Progress Notes (Signed)
States that her blood pressure numbers has been a little elevated.  No other concerns.

## 2019-12-02 ENCOUNTER — Telehealth: Payer: Self-pay

## 2019-12-02 NOTE — Telephone Encounter (Signed)
Patient is calling in regards to wanting advice for treating a yeast infection at home. Patient states she has trouble with certain creams.

## 2019-12-02 NOTE — Telephone Encounter (Signed)
OV 04/2019  Spoke with pt. Pt states having vaginal irritation and itching x 2-3 days. Pt states would like recommendations for home remedy. Pt declines OV at this time. Pt describes feeling vaginal dryness after cycle. Does not currently use anything for vaginal dryness.   Pt advised can you OTC monistat 3 over weekend or try coconut oil or olive oil if thinks this is more vaginal dryness vs yeast. Pt denies vaginal discharge or odor, fever, chills, back pain and all UTI sx. Pt states has not had yeast sx x 10 years.  Pt advised to call back to office on Tuesday if sx are not resolved over weekend or can be seen at [facility] or PCP. Pt agreeable and verbalized understanding. Pt thankful for advice.  Encounter closed.  Routing to Dr Hyacinth Meeker for [facility]

## 2019-12-20 ENCOUNTER — Ambulatory Visit (INDEPENDENT_AMBULATORY_CARE_PROVIDER_SITE_OTHER): Payer: 59 | Admitting: Podiatry

## 2019-12-20 ENCOUNTER — Other Ambulatory Visit: Payer: Self-pay

## 2019-12-20 DIAGNOSIS — B078 Other viral warts: Secondary | ICD-10-CM | POA: Diagnosis not present

## 2019-12-20 DIAGNOSIS — M79671 Pain in right foot: Secondary | ICD-10-CM

## 2019-12-20 DIAGNOSIS — B079 Viral wart, unspecified: Secondary | ICD-10-CM

## 2019-12-20 NOTE — Patient Instructions (Signed)
Take dressing off in 8 hours and wash the foot with soap and water. If it is hurting or becomes uncomfortable before the 8 hours, go ahead and remove the bandage and wash the area.  If it blisters, apply antibiotic ointment and a band-aid.  Monitor for any signs/symptoms of infection. Call the office immediately if any occur or go directly to the emergency room. Call with any questions/concerns.  --  I have ordered a medication for you that will come from [facility] in [place]. They should be calling you to verify insurance and will mail the medication to you. If you live close by then you can go by their pharmacy to pick up the medication. Their phone number is [phone]. If you do not hear from them in the next few days, please give [place] a call at [phone].   ---   Warts  Warts are small growths on the skin. They are common, and they are caused by a virus. Warts can be found on many parts of the body. A person may have one wart or many warts. Most warts will go away on their own with time, but this could take many months to a few years. Treatments may be done if needed. What are the causes? Warts are caused by a type of virus that is called HPV.  This virus can spread from person to person through touching.  Warts can also spread to other parts of the body when a person scratches a wart and then scratches normal skin. What increases the risk? You are more likely to get warts if:  You are 61-88 years old.  You have a weak body defense system (immune system).  You are Caucasian. What are the signs or symptoms? The main symptom of this condition is small growths on the skin. Warts may:  Be round, oval, or have an uneven shape.  Feel rough to the touch.  Be the color of your skin or light yellow, brown, or gray.  Often be less than  inch (1.3 cm) in size.  Go away and then come back again. Most warts do not hurt, but some can hurt if they are large or if they  are on the bottom of your feet. How is this diagnosed? A wart can often be diagnosed by how it looks. In some cases, the doctor might remove a little bit of the wart to test it (biopsy). How is this treated? Most of the time, warts do not need treatment. Sometimes people want warts removed. If treatment is needed or wanted, options may include:  Putting creams or patches with medicine in them on the wart.  Putting duct tape over the top of the wart.  Freezing the wart.  Burning the wart with: ? A laser. ? An electric probe.  Giving a shot of medicine into the wart to help the body's defense system fight off the wart.  Surgery to remove the wart. Follow these instructions at home:  Medicines  Apply over-the-counter and prescription medicines only as told by your doctor.  Do not apply over-the-counter wart medicines to your face or genitals before you ask your doctor if it is okay to do that. Lifestyle  Keep your body's defense system healthy. To do this: ? Eat a healthy diet. ? Get enough sleep. ? Do not use any products that contain nicotine or tobacco, such as cigarettes and e-cigarettes. If you need help quitting, ask your doctor. General instructions  Wash your hands after  you touch a wart.  Do not scratch or pick at a wart.  Avoid shaving hair that is over a wart.  Keep all follow-up visits as told by your doctor. This is important. Contact a doctor if:  Your warts do not get better after treatment.  You have redness, swelling, or pain at the site of a wart.  You have bleeding from a wart, and the bleeding does not stop when you put light pressure on the wart.  You have diabetes and you get a wart. Summary  Warts are small growths on the skin. They are common, and they are caused by a virus.  Most of the time, warts do not need treatment. Sometimes people want warts removed. If treatment is needed or wanted, there are many options.  Apply over-the-counter  and prescription medicines only as told by your doctor.  Wash your hands after you touch a wart.  Keep all follow-up visits as told by your doctor. This is important. This information is not intended to replace advice given to you by your health care provider. Make sure you discuss any questions you have with your health care provider. Document Revised: 08/04/2017 Document Reviewed: 08/04/2017 Elsevier Patient Education  2020 [employer].

## 2019-12-21 NOTE — Progress Notes (Signed)
Subjective:   Patient ID: Cheryl Christian, female   DOB: 39 y.o.   MRN: [id number]   HPI 39 year old female presents the office for concerns of plantar warts of the right foot x3 which is been ongoing for 4 years but started become tender.  Denies any redness or drainage.  She has tried over-the-counter Compound W for short time.  No other concerns today.  Review of Systems  All other systems reviewed and are negative.  Past Medical History:  Diagnosis Date  . Essential hypertension 06/22/2018  . Hypothyroid 05/20/2018  . 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 Preeclampsia     Past Surgical History:  Procedure Laterality Date  . EXCISION MASS ABDOMINAL Right 04/12/2019   Procedure: EXCISION ABDOMINAL WALL MASS;  Surgeon: Emelia Loron, MD;  Location: [facility];  Service: General;  Laterality: Right;  GENERAL AND TAP BLOCK ANESTHESIA  . WISDOM TOOTH EXTRACTION     39 yo     Current Outpatient Medications:  .  hydrochlorothiazide (HYDRODIURIL) 12.5 MG tablet, TAKE 1 TABLET BY MOUTH  DAILY, Disp: 90 tablet, Rfl: 3 .  ibuprofen (ADVIL) 400 MG tablet, Take 400 mg by mouth every 6 (six) hours as needed for headache., Disp: , Rfl:  .  levothyroxine (SYNTHROID) 50 MCG tablet, TAKE 1 TABLET BY MOUTH EVERY DAY ON EMPTY STOMACH IN THE MORNING, Disp: 90 tablet, Rfl: 3 .  losartan (COZAAR) 100 MG tablet, Take 1 tablet (100 mg total) by mouth daily., Disp: 30 tablet, Rfl: 0 .  losartan-hydrochlorothiazide (HYZAAR) 100-25 MG tablet, Take 1 tablet by mouth daily., Disp: 90 tablet, Rfl: 3 .  norethindrone (MICRONOR) 0.35 MG tablet, TAKE 1 TABLET BY MOUTH EVERY DAY, Disp: 84 tablet, Rfl: 1 .  sertraline (ZOLOFT) 100 MG tablet, Take 1 tablet (100 mg total) by mouth daily., Disp: 90 tablet, Rfl: 3  No Known Allergies      Objective:  Physical Exam  General: AAO x3, NAD  Dermatological: 3 areas on plantar aspect the right forefoot with thick hyperkeratotic  tissue and upon debridement there is evidence of verruca.  No edema, erythema or clinical signs of infection are noted today.  Vascular: Dorsalis Pedis artery and Posterior Tibial artery pedal pulses are 2/4 bilateral with immedate capillary fill time.  There is no pain with calf compression, swelling, warmth, erythema.   Neruologic: Grossly intact via light touch bilateral.   Musculoskeletal: Subjectively tenderness of the hyperkeratotic lesion/verruca but no other areas of discomfort muscular strength 5/5 in all groups tested bilateral.  Gait: Unassisted, Nonantalgic.       Assessment:   Verruca right foot     Plan:  -Treatment options discussed including all alternatives, risks, and complications -Etiology of symptoms were discussed -Sharply debrided lesions without any complications or bleeding.  Skin was cleaned with alcohol and Cantharone was applied followed by an occlusive bandage.  Post procedure instructions discussed. -Today ordered a compound cream to the heel and apothecary to start using next week as well.  Vivi Barrack DPM

## 2019-12-23 ENCOUNTER — Ambulatory Visit (INDEPENDENT_AMBULATORY_CARE_PROVIDER_SITE_OTHER): Payer: 59 | Admitting: Primary Care

## 2020-01-03 ENCOUNTER — Other Ambulatory Visit: Payer: Self-pay

## 2020-01-03 ENCOUNTER — Encounter (INDEPENDENT_AMBULATORY_CARE_PROVIDER_SITE_OTHER): Payer: Self-pay | Admitting: Primary Care

## 2020-01-03 ENCOUNTER — Ambulatory Visit (INDEPENDENT_AMBULATORY_CARE_PROVIDER_SITE_OTHER): Payer: 59 | Admitting: Primary Care

## 2020-01-03 VITALS — BP 137/90 | HR 89 | Temp 98.4°F | Ht 66.0 in | Wt 127.6 lb

## 2020-01-03 DIAGNOSIS — Z13220 Encounter for screening for lipoid disorders: Secondary | ICD-10-CM

## 2020-01-03 DIAGNOSIS — Z23 Encounter for immunization: Secondary | ICD-10-CM

## 2020-01-03 DIAGNOSIS — E039 Hypothyroidism, unspecified: Secondary | ICD-10-CM

## 2020-01-03 DIAGNOSIS — Z7689 Persons encountering health services in other specified circumstances: Secondary | ICD-10-CM

## 2020-01-03 DIAGNOSIS — I10 Essential (primary) hypertension: Secondary | ICD-10-CM | POA: Diagnosis not present

## 2020-01-03 DIAGNOSIS — Z1159 Encounter for screening for other viral diseases: Secondary | ICD-10-CM

## 2020-01-03 NOTE — Patient Instructions (Signed)

## 2020-01-03 NOTE — Progress Notes (Signed)
Established Patient Office Visit  Subjective:  Patient ID: Cheryl Christian, female    DOB: February 24, 1981  Age: 39 y.o. MRN: [id number]  CC:  Chief Complaint  Patient presents with  . Blood Pressure Check    HPI Ms. Cheryl Christian is a 15 presents for blood pressure management. Denies shortness of breath, headaches, chest pain or lower extremity edema.  Past Medical History:  Diagnosis Date  . Essential hypertension 06/22/2018  . Hypothyroid 05/20/2018  . 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 Preeclampsia     Past Surgical History:  Procedure Laterality Date  . EXCISION MASS ABDOMINAL Right 04/12/2019   Procedure: EXCISION ABDOMINAL WALL MASS;  Surgeon: Emelia Loron, MD;  Location: [facility];  Service: General;  Laterality: Right;  GENERAL AND TAP BLOCK ANESTHESIA  . WISDOM TOOTH EXTRACTION     39 yo    Family History  Problem Relation Age of Onset  . Hypertension Mother   . Lung disease Mother        lymphangioleimyomatosis (LAMB), s/p transplant  . Diabetes Paternal Grandfather     Social History   Socioeconomic History  . Marital status: Married    Spouse name: Not on file  . Number of children: Not on file  . Years of education: Not on file  . Highest education level: Not on file  Occupational History  . Not on file  Tobacco Use  . Smoking status: Never Smoker  . Smokeless tobacco: Never Used  Vaping Use  . Vaping Use: Never used  Substance and Sexual Activity  . Alcohol use: Yes    Alcohol/week: 12.0 - 14.0 standard drinks    Types: 12 - 14 Standard drinks or equivalent per week  . Drug use: Never  . Sexual activity: Yes    Birth control/protection: Surgical    Comment: Husband has vasectomy  Other Topics Concern  . Not on file  Social History Narrative  . Not on file   Social Determinants of Health   Financial Resource Strain:   . Difficulty of Paying Living Expenses: Not on file  Food Insecurity:   . Worried  About Programme researcher, broadcasting/film/video in the Last Year: Not on file  . Ran Out of Food in the Last Year: Not on file  Transportation Needs:   . Lack of Transportation (Medical): Not on file  . Lack of Transportation (Non-Medical): Not on file  Physical Activity:   . Days of Exercise per Week: Not on file  . Minutes of Exercise per Session: Not on file  Stress:   . Feeling of Stress : Not on file  Social Connections:   . Frequency of Communication with Friends and Family: Not on file  . Frequency of Social Gatherings with Friends and Family: Not on file  . Attends Religious Services: Not on file  . Active Member of Clubs or Organizations: Not on file  . Attends Banker Meetings: Not on file  . Marital Status: Not on file  Intimate Partner Violence:   . Fear of Current or Ex-Partner: Not on file  . Emotionally Abused: Not on file  . Physically Abused: Not on file  . Sexually Abused: Not on file    Outpatient Medications Prior to Visit  Medication Sig Dispense Refill  . ibuprofen (ADVIL) 400 MG tablet Take 400 mg by mouth every 6 (six) hours as needed for headache.    . levothyroxine (SYNTHROID) 50 MCG tablet TAKE 1 TABLET BY  MOUTH EVERY DAY ON EMPTY STOMACH IN THE MORNING 90 tablet 3  . losartan-hydrochlorothiazide (HYZAAR) 100-25 MG tablet Take 1 tablet by mouth daily. 90 tablet 3  . sertraline (ZOLOFT) 100 MG tablet Take 1 tablet (100 mg total) by mouth daily. 90 tablet 3  . hydrochlorothiazide (HYDRODIURIL) 12.5 MG tablet TAKE 1 TABLET BY MOUTH  DAILY 90 tablet 3  . losartan (COZAAR) 100 MG tablet Take 1 tablet (100 mg total) by mouth daily. 30 tablet 0  . norethindrone (MICRONOR) 0.35 MG tablet TAKE 1 TABLET BY MOUTH EVERY DAY 84 tablet 1   No facility-administered medications prior to visit.    No Known Allergies  ROS Review of Systems  All other systems reviewed and are negative.     Objective:    Physical Exam Constitutional:      Appearance: She is normal  weight.  HENT:     Right Ear: Tympanic membrane normal.     Left Ear: Tympanic membrane normal.     Nose: Nose normal.  Cardiovascular:     Rate and Rhythm: Normal rate and regular rhythm.  Pulmonary:     Effort: Pulmonary effort is normal.     Breath sounds: Normal breath sounds.  Abdominal:     General: Abdomen is flat. Bowel sounds are normal.  Musculoskeletal:        General: Normal range of motion.     Cervical back: Normal range of motion.  Skin:    General: Skin is warm and dry.  Neurological:     Mental Status: She is alert and oriented to person, place, and time.  Psychiatric:        Mood and Affect: Mood normal.        Behavior: Behavior normal.        Thought Content: Thought content normal.        Judgment: Judgment normal.     BP 137/90 (BP Location: Left Arm, Patient Position: Sitting, Cuff Size: Normal)   Pulse 89   Temp 98.4 F (36.9 C) (Temporal)   Ht 5\' 6"  (1.676 m)   Wt 127 lb 9.6 oz (57.9 kg)   LMP 12/14/2019   SpO2 100%   BMI 20.60 kg/m  Wt Readings from Last 3 Encounters:  01/03/20 127 lb 9.6 oz (57.9 kg)  11/11/19 131 lb (59.4 kg)  04/26/19 134 lb (60.8 kg)     There are no preventive care reminders to display for this patient.  There are no preventive care reminders to display for this patient.  Lab Results  Component Value Date   TSH 3.680 01/03/2020   Lab Results  Component Value Date   WBC 6.7 01/03/2020   HGB 14.6 01/03/2020   HCT 42.9 01/03/2020   MCV 91 01/03/2020   PLT 266 01/03/2020   Lab Results  Component Value Date   NA 138 01/03/2020   K 3.8 01/03/2020   CO2 24 01/03/2020   GLUCOSE 101 (H) 01/03/2020   BUN 11 01/03/2020   CREATININE 0.72 01/03/2020   BILITOT 0.6 01/03/2020   ALKPHOS 43 (L) 01/03/2020   AST 23 01/03/2020   ALT 24 01/03/2020   PROT 7.5 01/03/2020   ALBUMIN 5.2 (H) 01/03/2020   CALCIUM 9.9 01/03/2020   ANIONGAP 6 04/07/2019   Lab Results  Component Value Date   CHOL 186 01/03/2020   Lab  Results  Component Value Date   HDL 98 01/03/2020   Lab Results  Component Value Date   LDLCALC 72 01/03/2020  Lab Results  Component Value Date   TRIG 89 01/03/2020   Lab Results  Component Value Date   CHOLHDL 1.9 01/03/2020   No results found for: HGBA1C    Assessment & Plan:  Cheryl Christian was seen today for blood pressure check.  Diagnoses and all orders for this visit:  Essential hypertension Blood pressure is not at goal but has improved significantly.  Patient does take her blood pressure at home and her systolic ranges from 119-128 and diastolic has ranged from 82-87 will continue Hyzaar 100/25.  Underlining elevation probable whitecoat syndrome.  Goal of therapy still continues to be 130/80 which is generally her readings at home.  She continues to monitor her sodium intake and exercises- Just Dance Make  Acquired hypothyroidism Thyroid was previously managed by previous provider will check TSH/T4 currently on Levoxyl 50 mcg daily.  Screening for lipid disorders Fasting lipid-health maintenance  Need for hepatitis C screening test Health maintenance and care gap -     Hepatitis C Antibody   No orders of the defined types were placed in this encounter.   Follow-up: Return in about 2 months (around 03/04/2020) for TSH/T4 labs.    Grayce Sessions, NP

## 2020-01-04 LAB — CBC WITH DIFFERENTIAL/PLATELET
Basophils Absolute: 0 10*3/uL (ref 0.0–0.2)
Basos: 0 %
EOS (ABSOLUTE): 0 10*3/uL (ref 0.0–0.4)
Eos: 1 %
Hematocrit: 42.9 % (ref 34.0–46.6)
Hemoglobin: 14.6 g/dL (ref 11.1–15.9)
Immature Grans (Abs): 0 10*3/uL (ref 0.0–0.1)
Immature Granulocytes: 0 %
Lymphocytes Absolute: 1.7 10*3/uL (ref 0.7–3.1)
Lymphs: 26 %
MCH: 30.9 pg (ref 26.6–33.0)
MCHC: 34 g/dL (ref 31.5–35.7)
MCV: 91 fL (ref 79–97)
Monocytes Absolute: 0.5 10*3/uL (ref 0.1–0.9)
Monocytes: 8 %
Neutrophils Absolute: 4.3 10*3/uL (ref 1.4–7.0)
Neutrophils: 65 %
Platelets: 266 10*3/uL (ref 150–450)
RBC: 4.73 x10E6/uL (ref 3.77–5.28)
RDW: 12.3 % (ref 11.7–15.4)
WBC: 6.7 10*3/uL (ref 3.4–10.8)

## 2020-01-04 LAB — HEPATITIS C ANTIBODY: Hep C Virus Ab: <0.1 s/co ratio (ref 0.0–0.9)

## 2020-01-04 LAB — CMP14+EGFR
ALT: 24 IU/L (ref 0–32)
AST: 23 IU/L (ref 0–40)
Albumin/Globulin Ratio: 2.3 — ABNORMAL HIGH (ref 1.2–2.2)
Albumin: 5.2 g/dL — ABNORMAL HIGH (ref 3.8–4.8)
Alkaline Phosphatase: 43 IU/L — ABNORMAL LOW (ref 44–121)
BUN/Creatinine Ratio: 15 (ref 9–23)
BUN: 11 mg/dL (ref 6–24)
Bilirubin Total: 0.6 mg/dL (ref 0.0–1.2)
CO2: 24 mmol/L (ref 20–29)
Calcium: 9.9 mg/dL (ref 8.7–10.2)
Chloride: 99 mmol/L (ref 96–106)
Creatinine, Ser: 0.72 mg/dL (ref 0.57–1.00)
GFR calc Af Amer: 120 mL/min/{1.73_m2} (ref >59)
GFR calc non Af Amer: 104 mL/min/{1.73_m2} (ref >59)
Globulin, Total: 2.3 g/dL (ref 1.5–4.5)
Glucose: 101 mg/dL — ABNORMAL HIGH (ref 65–99)
Potassium: 3.8 mmol/L (ref 3.5–5.2)
Sodium: 138 mmol/L (ref 134–144)
Total Protein: 7.5 g/dL (ref 6.0–8.5)

## 2020-01-04 LAB — TSH+FREE T4
Free T4: 1.16 ng/dL (ref 0.82–1.77)
TSH: 3.68 u[IU]/mL (ref 0.450–4.500)

## 2020-01-04 LAB — LIPID PANEL
Chol/HDL Ratio: 1.9 ratio (ref 0.0–4.4)
Cholesterol, Total: 186 mg/dL (ref 100–199)
HDL: 98 mg/dL (ref >39)
LDL Chol Calc (NIH): 72 mg/dL (ref 0–99)
Triglycerides: 89 mg/dL (ref 0–149)
VLDL Cholesterol Cal: 16 mg/dL (ref 5–40)

## 2020-01-09 ENCOUNTER — Telehealth (INDEPENDENT_AMBULATORY_CARE_PROVIDER_SITE_OTHER): Payer: Self-pay | Admitting: Primary Care

## 2020-01-09 NOTE — Telephone Encounter (Signed)
Patient was able to view most recent thyroid labs,  results reflect normal. Patient would like levothyroxine (SYNTHROID) sent to the pharmacy.    [facility] - [place], [place] - [address] Phone:  [phone]  Fax:  [phone]

## 2020-01-09 NOTE — Telephone Encounter (Signed)
Labs were normal including TSH. Is there a need for refill of levothyroxine? Please advise.

## 2020-01-10 ENCOUNTER — Other Ambulatory Visit (INDEPENDENT_AMBULATORY_CARE_PROVIDER_SITE_OTHER): Payer: Self-pay | Admitting: Primary Care

## 2020-01-10 DIAGNOSIS — E039 Hypothyroidism, unspecified: Secondary | ICD-10-CM

## 2020-01-10 MED ORDER — LEVOTHYROXINE SODIUM 50 MCG PO TABS: ORAL_TABLET | ORAL | 0 refills | Status: DC

## 2020-01-10 NOTE — Telephone Encounter (Signed)
Thyroid normal due to being on medication. Refills send reck in 6 weeks

## 2020-01-10 NOTE — Telephone Encounter (Signed)
Patient requests a call from the doctor or nurse to explain further regarding her Thyroid medication.  She stated her thyroid is normal because she was on her medication but stated she does not have any more meds and has been without for several days.  Please call to discuss at [phone]

## 2020-01-10 NOTE — Telephone Encounter (Signed)
Patient is aware that medication has been sent to her pharmacy.

## 2020-01-26 ENCOUNTER — Ambulatory Visit (INDEPENDENT_AMBULATORY_CARE_PROVIDER_SITE_OTHER): Payer: 59 | Admitting: Podiatry

## 2020-01-26 ENCOUNTER — Other Ambulatory Visit: Payer: Self-pay

## 2020-01-26 DIAGNOSIS — B079 Viral wart, unspecified: Secondary | ICD-10-CM | POA: Diagnosis not present

## 2020-01-27 DIAGNOSIS — B079 Viral wart, unspecified: Secondary | ICD-10-CM | POA: Insufficient documentation

## 2020-01-27 NOTE — Progress Notes (Signed)
Subjective: 38 year old female presents the office today for evaluation of warts on the right foot.  She has been using compound cream and she thinks therapy is somewhat better.  She had no complications with the last treatment of the Cantharone.  No new concerns today. Denies any systemic complaints such as fevers, chills, nausea, vomiting. No acute changes since last appointment, and no other complaints at this time.   Objective: AAO x3, NAD DP/PT pulses palpable bilaterally, CRT less than 3 seconds Lesions are present on the right foot submetatarsal area 3 distinct areas.  Upon debride there is macerated tissue but appear to be more superficial.  There is no surrounding erythema, ascending cellulitis but there is no fluctuation crepitation there is no malodor. No pain with calf compression, swelling, warmth, erythema  Assessment: Verruca right foot  Plan: -All treatment options discussed with the patient including all alternatives, risks, complications.  -Plan B lesions were any complications or bleeding.  The area more superficial.  Following standard precautions and proper eyewear I lasered the warts.  Any complications.  She is to continue the topical compound cream as well.  Continue offloading as needed. -Patient encouraged to call the office with any questions, concerns, change in symptoms.   Vivi Barrack DPM

## 2020-02-23 ENCOUNTER — Other Ambulatory Visit: Payer: Self-pay | Admitting: Obstetrics & Gynecology

## 2020-02-23 DIAGNOSIS — R4586 Emotional lability: Secondary | ICD-10-CM

## 2020-02-27 NOTE — Telephone Encounter (Signed)
Medication refill request: Sertraline 100mg   Last AEX:  04/26/19 Next AEX: 07/05/20 Last MMG (if hormonal medication request): NA Refill authorized: 90/0

## 2020-03-05 ENCOUNTER — Other Ambulatory Visit (INDEPENDENT_AMBULATORY_CARE_PROVIDER_SITE_OTHER): Payer: 59

## 2020-03-05 ENCOUNTER — Other Ambulatory Visit: Payer: Self-pay

## 2020-03-05 DIAGNOSIS — E039 Hypothyroidism, unspecified: Secondary | ICD-10-CM

## 2020-03-06 ENCOUNTER — Other Ambulatory Visit (INDEPENDENT_AMBULATORY_CARE_PROVIDER_SITE_OTHER): Payer: Self-pay | Admitting: Primary Care

## 2020-03-06 DIAGNOSIS — E039 Hypothyroidism, unspecified: Secondary | ICD-10-CM

## 2020-03-06 LAB — TSH+FREE T4
Free T4: 1.19 ng/dL (ref 0.82–1.77)
TSH: 3.1 u[IU]/mL (ref 0.450–4.500)

## 2020-03-06 MED ORDER — LEVOTHYROXINE SODIUM 50 MCG PO TABS: ORAL_TABLET | ORAL | 0 refills | Status: DC

## 2020-03-12 ENCOUNTER — Ambulatory Visit (INDEPENDENT_AMBULATORY_CARE_PROVIDER_SITE_OTHER): Payer: 59 | Admitting: Podiatry

## 2020-03-12 ENCOUNTER — Other Ambulatory Visit: Payer: Self-pay

## 2020-03-12 DIAGNOSIS — B079 Viral wart, unspecified: Secondary | ICD-10-CM | POA: Diagnosis not present

## 2020-03-12 DIAGNOSIS — M79671 Pain in right foot: Secondary | ICD-10-CM

## 2020-03-18 NOTE — Progress Notes (Signed)
Subjective: 39 year old female presents the office today for follow- up evaluation of warts on the right foot.  She does therapy somewhat better.  Denies any pain to the area no redness or drainage or swelling.  She had no complications after the laser treatment. Denies any systemic complaints such as fevers, chills, nausea, vomiting. No acute changes since last appointment, and no other complaints at this time.   Objective: AAO x3, NAD DP/PT pulses palpable bilaterally, CRT less than 3 seconds Lesions are present on the right foot submetatarsal area 3 distinct areas but appear to be somewhat more superficial today. There is no surrounding erythema, ascending cellulitis but there is no fluctuation crepitation there is no malodor. No pain with calf compression, swelling, warmth, erythema  Assessment: Verruca right foot  Plan: -All treatment options discussed with the patient including all alternatives, risks, complications.  -Sharply debrided the lesions were any complications or bleeding. Following standard precautions and proper eyewear I lasered the warts without any complications.  She is to continue the topical compound cream as well.  Continue offloading as needed. -Patient encouraged to call the office with any questions, concerns, change in symptoms.   Vivi Barrack DPM

## 2020-04-10 ENCOUNTER — Other Ambulatory Visit: Payer: Self-pay

## 2020-04-10 ENCOUNTER — Ambulatory Visit (INDEPENDENT_AMBULATORY_CARE_PROVIDER_SITE_OTHER): Payer: 59 | Admitting: Podiatry

## 2020-04-10 DIAGNOSIS — B079 Viral wart, unspecified: Secondary | ICD-10-CM | POA: Diagnosis not present

## 2020-04-10 DIAGNOSIS — M79671 Pain in right foot: Secondary | ICD-10-CM | POA: Diagnosis not present

## 2020-04-15 NOTE — Progress Notes (Signed)
Subjective: 39 year old female presents the office today for follow- up evaluation of warts on the right foot.  She feels that they are getting smaller.  She is not having any complications after the laser treatment and she is to using the topical compound cream as well.  She does get occasional discomfort to the area with pressure but denies any redness or drainage or any swelling.  Denies any systemic complaints such as fevers, chills, nausea, vomiting. No acute changes since last appointment, and no other complaints at this time.   Objective: AAO x3, NAD DP/PT pulses palpable bilaterally, CRT less than 3 seconds Lesions are present on the right foot submetatarsal area 3 distinct areas currently.  Continue be more superficial and getting smaller.  There is no pain today and there is no surrounding erythema, ascending cellulitis but there is no fluctuation crepitation there is no malodor. No pain with calf compression, swelling, warmth, erythema  Assessment: Verruca right foot  Plan: -All treatment options discussed with the patient including all alternatives, risks, complications.  -Sharply debrided the lesions were any complications or bleeding. Following standard precautions and proper eyewear I lasered the warts without any complications.  She is to continue the topical compound cream as well.  Continue offloading as needed. -Patient encouraged to call the office with any questions, concerns, change in symptoms.   Vivi Barrack DPM

## 2020-05-08 ENCOUNTER — Ambulatory Visit: Payer: 59 | Admitting: Podiatry

## 2020-05-16 ENCOUNTER — Other Ambulatory Visit: Payer: Self-pay | Admitting: Obstetrics and Gynecology

## 2020-05-16 DIAGNOSIS — R4586 Emotional lability: Secondary | ICD-10-CM

## 2020-05-21 ENCOUNTER — Telehealth (HOSPITAL_BASED_OUTPATIENT_CLINIC_OR_DEPARTMENT_OTHER): Payer: Self-pay | Admitting: Obstetrics & Gynecology

## 2020-05-21 NOTE — Telephone Encounter (Signed)
Called and left message to please call the office to schedule  the appointment with Dr.Miller .

## 2020-07-05 ENCOUNTER — Ambulatory Visit: Payer: 59

## 2020-07-16 ENCOUNTER — Other Ambulatory Visit (INDEPENDENT_AMBULATORY_CARE_PROVIDER_SITE_OTHER): Payer: Self-pay | Admitting: Primary Care

## 2020-07-16 DIAGNOSIS — E039 Hypothyroidism, unspecified: Secondary | ICD-10-CM

## 2020-07-16 DIAGNOSIS — I10 Essential (primary) hypertension: Secondary | ICD-10-CM

## 2020-07-16 MED ORDER — LOSARTAN POTASSIUM-HCTZ 100-25 MG PO TABS: 1.0000 | ORAL_TABLET | Freq: Every day | ORAL | 1 refills | Status: DC

## 2020-07-16 MED ORDER — LEVOTHYROXINE SODIUM 50 MCG PO TABS: ORAL_TABLET | ORAL | 0 refills | Status: DC

## 2020-08-08 ENCOUNTER — Ambulatory Visit: Payer: 59 | Admitting: Physician Assistant

## 2020-08-16 ENCOUNTER — Ambulatory Visit (HOSPITAL_BASED_OUTPATIENT_CLINIC_OR_DEPARTMENT_OTHER): Payer: 59 | Admitting: Obstetrics & Gynecology

## 2020-08-26 ENCOUNTER — Other Ambulatory Visit: Payer: Self-pay | Admitting: Obstetrics & Gynecology

## 2020-08-26 DIAGNOSIS — R4586 Emotional lability: Secondary | ICD-10-CM

## 2020-08-28 NOTE — Telephone Encounter (Signed)
Refill request came for pt.  Can you confirm she wants this to Mail order.  She has appt scheduled for August.  Ok to refill with #90/1 RF until that time.  Thanks.

## 2020-08-28 NOTE — Telephone Encounter (Signed)
Cheryl Christian is a 40 y.o. female was contacted re: message below. Pt said she did not request a refill and is not in need of a refill at this time. Pt said she will see you first before she needs a new refill.

## 2020-10-04 ENCOUNTER — Ambulatory Visit (INDEPENDENT_AMBULATORY_CARE_PROVIDER_SITE_OTHER): Payer: 59 | Admitting: Family Medicine

## 2020-10-04 ENCOUNTER — Other Ambulatory Visit: Payer: Self-pay

## 2020-10-04 ENCOUNTER — Encounter: Payer: Self-pay | Admitting: Family Medicine

## 2020-10-04 VITALS — BP 123/83 | HR 86 | Temp 98.3°F | Ht 66.0 in | Wt 130.4 lb

## 2020-10-04 DIAGNOSIS — N809 Endometriosis, unspecified: Secondary | ICD-10-CM

## 2020-10-04 DIAGNOSIS — I10 Essential (primary) hypertension: Secondary | ICD-10-CM

## 2020-10-04 DIAGNOSIS — F325 Major depressive disorder, single episode, in full remission: Secondary | ICD-10-CM

## 2020-10-04 DIAGNOSIS — N808 Other endometriosis: Secondary | ICD-10-CM | POA: Insufficient documentation

## 2020-10-04 DIAGNOSIS — F419 Anxiety disorder, unspecified: Secondary | ICD-10-CM

## 2020-10-04 DIAGNOSIS — L989 Disorder of the skin and subcutaneous tissue, unspecified: Secondary | ICD-10-CM

## 2020-10-04 DIAGNOSIS — E039 Hypothyroidism, unspecified: Secondary | ICD-10-CM | POA: Diagnosis not present

## 2020-10-04 LAB — COMPREHENSIVE METABOLIC PANEL
ALT: 17 U/L (ref 0–35)
AST: 18 U/L (ref 0–37)
Albumin: 4.8 g/dL (ref 3.5–5.2)
Alkaline Phosphatase: 32 U/L — ABNORMAL LOW (ref 39–117)
BUN: 16 mg/dL (ref 6–23)
CO2: 27 mEq/L (ref 19–32)
Calcium: 9.7 mg/dL (ref 8.4–10.5)
Chloride: 101 mEq/L (ref 96–112)
Creatinine, Ser: 0.71 mg/dL (ref 0.40–1.20)
GFR: 105.18 mL/min (ref >60.00)
Glucose, Bld: 83 mg/dL (ref 70–99)
Potassium: 3.6 mEq/L (ref 3.5–5.1)
Sodium: 137 mEq/L (ref 135–145)
Total Bilirubin: 1 mg/dL (ref 0.2–1.2)
Total Protein: 7.5 g/dL (ref 6.0–8.3)

## 2020-10-04 LAB — CBC
HCT: 40.1 % (ref 36.0–46.0)
Hemoglobin: 14.1 g/dL (ref 12.0–15.0)
MCHC: 35.1 g/dL (ref 30.0–36.0)
MCV: 87.7 fl (ref 78.0–100.0)
Platelets: 236 10*3/uL (ref 150.0–400.0)
RBC: 4.57 Mil/uL (ref 3.87–5.11)
RDW: 12.8 % (ref 11.5–15.5)
WBC: 7.4 10*3/uL (ref 4.0–10.5)

## 2020-10-04 LAB — TSH: TSH: 4.21 u[IU]/mL (ref 0.35–5.50)

## 2020-10-04 NOTE — Assessment & Plan Note (Signed)
Follow-up with psychiatry.  She is currently on BuSpar 10 mg twice daily.  Tolerating well.

## 2020-10-04 NOTE — Progress Notes (Signed)
Cheryl Christian is a 40 y.o. female who presents today for an office visit.  Assessment/Plan:  Chronic Problems Addressed Today: Anxiety Follow-up with psychiatry.  She is currently on BuSpar 10 mg twice daily.  Tolerating well.  Endometriosis Follows with gynecology.  Essential hypertension At goal on with losartan-HCTZ 100-25 daily.  We will check labs today.  Hypothyroid Check TSH.  She has been out of Synthroid for a couple of months.  We will likely restart previous dose 50 mcg daily.  Major depression in complete remission (HCC) Follows with psychiatry.  Skin lesion Has numerous benign-appearing moles.  Will place referral to dermatology for skin cancer screening.     Subjective:  HPI:  See A/P for status of chronic conditions.  She has been out of Synthroid for a couple of months.  Has noticed mild constipation and skin/hair changes.  ROS: Per HPI, otherwise a complete review of systems was negative.   PMH:  The following were reviewed and entered/updated in epic: 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   Preeclampsia    Patient Active Problem List   Diagnosis Date Noted   Endometriosis 10/04/2020   Anxiety 10/04/2020   Skin lesion 10/04/2020   Verruca 01/27/2020   Essential hypertension 06/22/2018   Major depression in complete remission (HCC) 05/20/2018   Hypothyroid 05/20/2018   Past Surgical History:  Procedure Laterality Date   EXCISION MASS ABDOMINAL Right 04/12/2019   Procedure: EXCISION ABDOMINAL WALL MASS;  Surgeon: Emelia Loron, MD;  Location: [facility];  Service: General;  Laterality: Right;  GENERAL AND TAP BLOCK ANESTHESIA   WISDOM TOOTH EXTRACTION     40 yo    Family History  Problem Relation Age of Onset   Hypertension Mother    Lung disease Mother        lymphangioleimyomatosis (LAMB), s/p  transplant   Hypertension Father    Thyroid disease Sister    Diabetes Paternal Grandfather     Medications- reviewed and updated Current Outpatient Medications  Medication Sig Dispense Refill   ibuprofen (ADVIL) 400 MG tablet Take 400 mg by mouth every 6 (six) hours as needed for headache.     losartan-hydrochlorothiazide (HYZAAR) 100-25 MG tablet Take 1 tablet by mouth daily. 90 tablet 1   busPIRone (BUSPAR) 10 MG tablet Take 10 mg by mouth 2 (two) times daily.     levothyroxine (SYNTHROID) 50 MCG tablet TAKE 1 TABLET BY MOUTH EVERY DAY ON EMPTY STOMACH IN THE MORNING 30 tablet 0   No current facility-administered medications for this visit.    Allergies-reviewed and updated No Known Allergies  Social History   Socioeconomic History   Marital status: Married    Spouse name: Not on file   Number of children: Not on file   Years of education: Not on file   Highest education level: Not on file  Occupational History   Not on file  Tobacco Use   Smoking status: Never   Smokeless tobacco: Never  Vaping Use   Vaping Use: Never used  Substance and Sexual Activity   Alcohol use: Yes    Alcohol/week: 12.0 - 14.0 standard drinks    Types: 12 - 14 Standard drinks or equivalent per week   Drug use: Never   Sexual activity: Yes    Birth control/protection: Surgical    Comment: Husband has vasectomy  Other Topics Concern  Not on file  Social History Narrative   Not on file   Social Determinants of Health   Financial Resource Strain: Not on file  Food Insecurity: Not on file  Transportation Needs: Not on file  Physical Activity: Not on file  Stress: Not on file  Social Connections: Not on file        Objective:  Physical Exam: BP 123/83   Pulse 86   Temp 98.3 F (36.8 C) (Temporal)   Ht 5\' 6"  (1.676 m)   Wt 130 lb 6.4 oz (59.1 kg)   LMP 09/11/2020   SpO2 100%   BMI 21.05 kg/m   Gen: No acute distress, resting comfortably CV: Regular rate and rhythm with no  murmurs appreciated Pulm: Normal work of breathing, clear to auscultation bilaterally with no crackles, wheezes, or rhonchi Neuro: Grossly normal, moves all extremities Psych: Normal affect and thought content      Larz Mark M. 09/13/2020, MD 10/04/2020 9:45 AM

## 2020-10-04 NOTE — Assessment & Plan Note (Signed)
Follows with gynecology. 

## 2020-10-04 NOTE — Assessment & Plan Note (Signed)
At goal on with losartan-HCTZ 100-25 daily.  We will check labs today.

## 2020-10-04 NOTE — Assessment & Plan Note (Signed)
Has numerous benign-appearing moles.  Will place referral to dermatology for skin cancer screening.

## 2020-10-04 NOTE — Assessment & Plan Note (Signed)
Follows with psychiatry. °

## 2020-10-04 NOTE — Assessment & Plan Note (Signed)
Check TSH.  She has been out of Synthroid for a couple of months.  We will likely restart previous dose 50 mcg daily.

## 2020-10-04 NOTE — Patient Instructions (Signed)
It was very nice to see you today!  We will check blood work today.  We will probably restart you on your previous dose of Synthroid depending on the results of your TSH.  I will place a referral for you to see dermatology.  I will see back in year.  Please come back to see me sooner if needed.  Take care, Dr Jimmey Ralph  PLEASE NOTE:  If you had any lab tests please let us know if you have not heard back within a few days. You may see your results on mychart before we have a chance to review them but we will give you a call once they are reviewed by [place]. If we ordered any referrals today, please let us know if you have not heard from their office within the next week.   Please try these tips to maintain a healthy lifestyle:  Eat at least 3 REAL meals and 1-2 snacks per day.  Aim for no more than 5 hours between eating.  If you eat breakfast, please do so within one hour of getting up.   Each meal should contain half fruits/vegetables, one quarter protein, and one quarter carbs (no bigger than a computer mouse)  Cut down on sweet beverages. This includes juice, soda, and sweet tea.   Drink at least 1 glass of water with each meal and aim for at least 8 glasses per day  Exercise at least 150 minutes every week.

## 2020-10-05 ENCOUNTER — Other Ambulatory Visit: Payer: Self-pay

## 2020-10-05 MED ORDER — LEVOTHYROXINE SODIUM 25 MCG PO TABS: 25.0000 ug | ORAL_TABLET | Freq: Every day | ORAL | 3 refills | Status: DC

## 2020-10-05 NOTE — Progress Notes (Signed)
Please inform patient of the following:  Her TSH level is normal as well as all of her other labs. Given that she is having symptoms we can restart her Synthroid to 25 mcg daily and we can recheck again in about 6 weeks.  If she does not want to restart, we can also stay off medications and continue to monitor.

## 2020-10-29 ENCOUNTER — Telehealth: Payer: Self-pay

## 2020-10-29 NOTE — Telephone Encounter (Signed)
Noted  

## 2020-10-29 NOTE — Telephone Encounter (Signed)
Type of forms received: phychiatric forms   Routed to:  Paperwork received by :    Individual made aware of 3-5 business day turn around (Y/N):  Form completed and patient made aware of charges(Y/N):   Faxed to :   Form location:  placed in parkers bin

## 2020-11-25 ENCOUNTER — Other Ambulatory Visit (INDEPENDENT_AMBULATORY_CARE_PROVIDER_SITE_OTHER): Payer: Self-pay | Admitting: Primary Care

## 2020-11-25 DIAGNOSIS — I10 Essential (primary) hypertension: Secondary | ICD-10-CM

## 2020-11-25 NOTE — Telephone Encounter (Signed)
Too soon- Last RF 07/17/19 #90 1 RF

## 2020-11-26 ENCOUNTER — Encounter (HOSPITAL_BASED_OUTPATIENT_CLINIC_OR_DEPARTMENT_OTHER): Payer: Self-pay | Admitting: Obstetrics & Gynecology

## 2020-11-26 ENCOUNTER — Telehealth: Payer: Self-pay

## 2020-11-26 ENCOUNTER — Other Ambulatory Visit: Payer: Self-pay

## 2020-11-26 ENCOUNTER — Other Ambulatory Visit (HOSPITAL_COMMUNITY)
Admission: RE | Admit: 2020-11-26 | Discharge: 2020-11-26 | Disposition: A | Discharge status: Home / Self Care | Payer: 59 | Source: Ambulatory Visit | Attending: Obstetrics & Gynecology | Admitting: Obstetrics & Gynecology

## 2020-11-26 ENCOUNTER — Ambulatory Visit (INDEPENDENT_AMBULATORY_CARE_PROVIDER_SITE_OTHER): Payer: 59 | Admitting: Obstetrics & Gynecology

## 2020-11-26 VITALS — BP 145/101 | HR 75 | Ht 66.0 in | Wt 131.6 lb

## 2020-11-26 DIAGNOSIS — N926 Irregular menstruation, unspecified: Secondary | ICD-10-CM | POA: Diagnosis not present

## 2020-11-26 DIAGNOSIS — N930 Postcoital and contact bleeding: Secondary | ICD-10-CM | POA: Diagnosis not present

## 2020-11-26 DIAGNOSIS — I10 Essential (primary) hypertension: Secondary | ICD-10-CM

## 2020-11-26 DIAGNOSIS — Z01419 Encounter for gynecological examination (general) (routine) without abnormal findings: Secondary | ICD-10-CM | POA: Diagnosis not present

## 2020-11-26 DIAGNOSIS — Z124 Encounter for screening for malignant neoplasm of cervix: Secondary | ICD-10-CM

## 2020-11-26 DIAGNOSIS — N808 Other endometriosis: Secondary | ICD-10-CM

## 2020-11-26 MED ORDER — NORETHINDRONE 0.35 MG PO TABS: 1.0000 | ORAL_TABLET | Freq: Every day | ORAL | 1 refills | Status: DC

## 2020-11-26 MED ORDER — LEVOTHYROXINE SODIUM 25 MCG PO TABS: 25.0000 ug | ORAL_TABLET | Freq: Every day | ORAL | 3 refills | Status: DC

## 2020-11-26 NOTE — Telephone Encounter (Signed)
Refill sent to pharmacy.   

## 2020-11-26 NOTE — Progress Notes (Signed)
40 y.o. Cheryl Christian Married White or Caucasian female here for annual exam.  Had excision of abdominal wall mass that was an endometrioma.  Did have improvement in pain after procedure.  Has noticed some new places in her abdominal wall that hurt.  Not sure if related to endometriosis.  Doesn't have a mass like she did in the past.  This isn't as severe. Not sure wants/needs any treatment at this time.  She is having some spotting before intercourse.  Also not sure if this is related to endometriosis.    Patient's last menstrual period was 11/03/2020.          Sexually active: Yes.    The current method of family planning is vasectomy.    Exercising: Yes.    Smoker:  no  Health Maintenance: Pap:  06/28/2018 Negative History of abnormal Pap:  no MMG:  ordered Colonoscopy:  guidelines reviewed TDaP:  2018 Hep C testing: 2021 Screening Labs: 12/2019   reports that she has never smoked. She has never used smokeless tobacco. She reports current alcohol use of about 12.0 - 14.0 standard drinks per week. She reports that she does not use drugs.  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   Preeclampsia     Past Surgical History:  Procedure Laterality Date   EXCISION MASS ABDOMINAL Right 04/12/2019   Procedure: EXCISION ABDOMINAL WALL MASS;  Surgeon: Emelia Loron, MD;  Location: [facility];  Service: General;  Laterality: Right;  GENERAL AND TAP BLOCK ANESTHESIA   WISDOM TOOTH EXTRACTION     40 yo    Current Outpatient Medications  Medication Sig Dispense Refill   busPIRone (BUSPAR) 10 MG tablet Take 10 mg by mouth 2 (two) times daily.     ibuprofen (ADVIL) 400 MG tablet Take 400 mg by mouth every 6 (six) hours as needed for headache.     levothyroxine (SYNTHROID) 25 MCG tablet Take 1 tablet (25 mcg total) by mouth daily. 90 tablet 3    losartan-hydrochlorothiazide (HYZAAR) 100-25 MG tablet Take 1 tablet by mouth daily. 90 tablet 1   No current facility-administered medications for this visit.    Family History  Problem Relation Age of Onset   Hypertension Mother    Lung disease Mother        lymphangioleimyomatosis (LAMB), s/p transplant   Hypertension Father    Thyroid disease Sister    Diabetes Paternal Grandfather     Review of Systems  All other systems reviewed and are negative.  Exam:   BP (!) 145/101 (BP Location: Right Arm, Patient Position: Sitting, Cuff Size: Small)   Pulse 75   Ht 5\' 6"  (1.676 m) Comment: reported  Wt 131 lb 9.6 oz (59.7 kg)   LMP 11/03/2020   BMI 21.24 kg/m   Height: 5\' 6"  (167.6 cm) (reported)  General appearance: alert, cooperative and appears stated age Head: Normocephalic, without obvious abnormality, atraumatic Neck: no adenopathy, supple, symmetrical, trachea midline and thyroid normal to inspection and palpation Lungs: clear to auscultation bilaterally Breasts: normal appearance, no masses or tenderness Heart: regular rate and rhythm Abdomen: soft, non-tender; bowel sounds normal; no masses,  no organomegaly Extremities: extremities normal, atraumatic, no cyanosis or edema Skin: Skin color, texture, turgor normal. No rashes or lesions Lymph nodes: Cervical, supraclavicular, and axillary nodes normal. No abnormal inguinal nodes palpated Neurologic: Grossly normal   Pelvic: External genitalia:  no  lesions              Urethra:  normal appearing urethra with no masses, tenderness or lesions              Bartholins and Skenes: normal                 Vagina: normal appearing vagina with normal color and no discharge, no lesions              Cervix: no lesions              Pap taken: Yes.   Bimanual Exam:  Uterus:  normal size, contour, position, consistency, mobility, non-tender              Adnexa: normal adnexa and no mass, fullness, tenderness                Rectovaginal: Confirms               Anus:  normal sphincter tone, no lesions  Chaperone, Ina Homes, CMA, was present for exam.  Assessment/Plan: 1. Well woman exam with routine gynecological exam - Pap smear with HR HPV 2020.  Pap repeated today due to spotting with intercourse - Mammogram ordered.  Pt has not started but will - Colonoscopy screening guidelines reviewed - lab work done done 12/2019 - vaccines reviewed/updated   2. Irregular bleeding - will have pt return for PUS to assess for polyps - US PELVIS TRANSVAGINAL NON-OB (TV ONLY); Future - will start micronor daily.  Instructions and side effects reviewed.  Rx to pharmacy.  3. Postcoital bleeding - Cytology - PAP( [place])  4. Essential hypertension - pt is on medication.  Aware elevated today.  Will monitor at home and if still elevated will follow up with PCP  5. Other endometriosis -s/p resection from abdominal wall.   - if micronor doesn't help, could consider GNRH antagonist therapy.

## 2020-11-26 NOTE — Telephone Encounter (Signed)
MEDICATION: levothyroxine (SYNTHROID) 25 MCG tablet  PHARMACY:  [id number] - [place], [place] - [address] [facility] [address] Phone:  [phone]  Fax:  [phone]      Comments:   **Let patient know to contact pharmacy at the end of the day to make sure medication is ready. **  ** Please notify patient to allow 48-72 hours to process**  **Encourage patient to contact the pharmacy for refills or they can request refills through [facility]**

## 2020-11-28 ENCOUNTER — Other Ambulatory Visit: Payer: Self-pay

## 2020-11-28 DIAGNOSIS — I10 Essential (primary) hypertension: Secondary | ICD-10-CM

## 2020-11-28 LAB — CYTOLOGY - PAP: Diagnosis: NEGATIVE

## 2020-11-28 MED ORDER — LOSARTAN POTASSIUM-HCTZ 100-25 MG PO TABS: 1.0000 | ORAL_TABLET | Freq: Every day | ORAL | 1 refills | Status: DC

## 2020-11-28 NOTE — Telephone Encounter (Signed)
.   Encourage patient to contact the pharmacy for refills or they can request refills through [facility]  LAST APPOINTMENT DATE:  10/04/20  NEXT APPOINTMENT DATE:N/A  MEDICATION:losartan-hydrochlorothiazide (HYZAAR) 100-25 MG tablet  Is the patient out of medication?  Yes   PHARMACY:[id number] - [place], [place] - [address] AT [address] [facility] [address]   Patient is requesting a new RX written by dr Jimmey Ralph due to patient being out of refills from prior provider

## 2020-11-28 NOTE — Telephone Encounter (Signed)
Last refill by historical provider  

## 2020-11-29 DIAGNOSIS — N930 Postcoital and contact bleeding: Secondary | ICD-10-CM | POA: Insufficient documentation

## 2020-11-29 DIAGNOSIS — N926 Irregular menstruation, unspecified: Secondary | ICD-10-CM | POA: Insufficient documentation

## 2020-12-05 ENCOUNTER — Other Ambulatory Visit: Payer: Self-pay

## 2020-12-05 ENCOUNTER — Ambulatory Visit (HOSPITAL_BASED_OUTPATIENT_CLINIC_OR_DEPARTMENT_OTHER)
Admission: RE | Admit: 2020-12-05 | Discharge: 2020-12-05 | Disposition: A | Discharge status: Home / Self Care | Payer: 59 | Source: Ambulatory Visit | Attending: Obstetrics & Gynecology | Admitting: Obstetrics & Gynecology

## 2020-12-05 DIAGNOSIS — Z1231 Encounter for screening mammogram for malignant neoplasm of breast: Secondary | ICD-10-CM | POA: Diagnosis not present

## 2020-12-05 DIAGNOSIS — Z124 Encounter for screening for malignant neoplasm of cervix: Secondary | ICD-10-CM | POA: Insufficient documentation

## 2020-12-12 ENCOUNTER — Encounter: Payer: Self-pay | Admitting: Family Medicine

## 2020-12-17 NOTE — Telephone Encounter (Signed)
Patient unsure if refills needed at this time  Will call later with more information

## 2020-12-18 ENCOUNTER — Other Ambulatory Visit: Payer: Self-pay

## 2020-12-18 MED ORDER — BUSPIRONE HCL 10 MG PO TABS: 10.0000 mg | ORAL_TABLET | Freq: Two times a day (BID) | ORAL | 1 refills | Status: DC

## 2021-01-16 ENCOUNTER — Other Ambulatory Visit: Payer: Self-pay

## 2021-01-16 ENCOUNTER — Ambulatory Visit (INDEPENDENT_AMBULATORY_CARE_PROVIDER_SITE_OTHER): Payer: 59 | Admitting: Obstetrics & Gynecology

## 2021-01-16 ENCOUNTER — Ambulatory Visit (INDEPENDENT_AMBULATORY_CARE_PROVIDER_SITE_OTHER): Payer: 59

## 2021-01-16 ENCOUNTER — Encounter (HOSPITAL_BASED_OUTPATIENT_CLINIC_OR_DEPARTMENT_OTHER): Payer: Self-pay | Admitting: Obstetrics & Gynecology

## 2021-01-16 VITALS — BP 152/97 | HR 72 | Wt 131.0 lb

## 2021-01-16 DIAGNOSIS — N926 Irregular menstruation, unspecified: Secondary | ICD-10-CM | POA: Diagnosis not present

## 2021-01-16 DIAGNOSIS — N83202 Unspecified ovarian cyst, left side: Secondary | ICD-10-CM | POA: Diagnosis not present

## 2021-01-19 NOTE — Progress Notes (Signed)
GYNECOLOGY  VISIT  CC:   follow up and discuss ultrasound  HPI: 40 y.o. G88P2012 Married White or Caucasian female here for discussion of ultrasound results and follow up after starting micronor for post coital spotting.  Pt does have hx of endometriosis in wall mass that was removed by Dr. Dwain Sarna.  Reports spotting has improved.  Husband will be traveling a lot coming up so may not be able to see how much more this will help for a few more months.  Not having any side effects with the medications.  Ultrasound showed:  Uterus: 8x8 x 5.0 x 6.3cm.  Volume: 145.39ml.  Endometrial thickness:  0.84cm, trilaminar.  Left ovary with 4.0 x 2.9cm simple ovarian cyst.  No free fluid in PCDS noted.    Pt aware ovarian cyst is simple in appearance and does not need dedicated follow up at this time.  Pap was normal 11/26/2020.  Patient Active Problem List   Diagnosis Date Noted   Irregular bleeding 11/29/2020   Postcoital bleeding 11/29/2020   Other endometriosis 10/04/2020   Anxiety 10/04/2020   Skin lesion 10/04/2020   Verruca 01/27/2020   Essential hypertension 06/22/2018   Hypothyroid 05/20/2018    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   Preeclampsia     Past Surgical History:  Procedure Laterality Date   EXCISION MASS ABDOMINAL Right 04/12/2019   Procedure: EXCISION ABDOMINAL WALL MASS;  Surgeon: Emelia Loron, MD;  Location: [facility];  Service: General;  Laterality: Right;  GENERAL AND TAP BLOCK ANESTHESIA   WISDOM TOOTH EXTRACTION     40 yo    MEDS:   Current Outpatient Medications on File Prior to Visit  Medication Sig Dispense Refill   busPIRone (BUSPAR) 10 MG tablet Take 1 tablet (10 mg total) by mouth 2 (two) times daily. 60 tablet 1   ibuprofen (ADVIL) 400 MG tablet Take 400 mg by mouth every 6 (six) hours as needed for headache.      levothyroxine (SYNTHROID) 25 MCG tablet Take 1 tablet (25 mcg total) by mouth daily. 90 tablet 3   losartan-hydrochlorothiazide (HYZAAR) 100-25 MG tablet Take 1 tablet by mouth daily. 90 tablet 1   norethindrone (MICRONOR) 0.35 MG tablet Take 1 tablet (0.35 mg total) by mouth daily. 84 tablet 1   No current facility-administered medications on file prior to visit.    ALLERGIES: Patient has no known allergies.  Family History  Problem Relation Age of Onset   Hypertension Mother    Lung disease Mother        lymphangioleimyomatosis (LAMB), s/p transplant   Hypertension Father    Thyroid disease Sister    Diabetes Paternal Grandfather     SH:  married, non smoker  Review of Systems  Constitutional: Negative.   Genitourinary:        Irregular bleeding   PHYSICAL EXAMINATION:    BP (!) 152/97   Pulse 72   Wt 131 lb (59.4 kg)   BMI 21.14 kg/m     General appearance: alert, cooperative and appears stated age   Assessment/Plan: 1. Irregular bleeding - continue micronor.  Pt is going to give me update after two more months.  If continues, will have pt return for ECC  2. Left ovarian cyst - no dedicated follow up needed at this time.

## 2021-02-11 ENCOUNTER — Other Ambulatory Visit: Payer: Self-pay | Admitting: Family Medicine

## 2021-02-28 ENCOUNTER — Telehealth (INDEPENDENT_AMBULATORY_CARE_PROVIDER_SITE_OTHER): Payer: 59 | Admitting: Family Medicine

## 2021-02-28 ENCOUNTER — Encounter: Payer: Self-pay | Admitting: Family Medicine

## 2021-02-28 VITALS — Ht 72.0 in | Wt 130.0 lb

## 2021-02-28 DIAGNOSIS — F419 Anxiety disorder, unspecified: Secondary | ICD-10-CM | POA: Diagnosis not present

## 2021-02-28 MED ORDER — SERTRALINE HCL 50 MG PO TABS: 50.0000 mg | ORAL_TABLET | Freq: Every day | ORAL | 3 refills | Status: DC

## 2021-02-28 MED ORDER — HYDROXYZINE HCL 50 MG PO TABS: 50.0000 mg | ORAL_TABLET | Freq: Three times a day (TID) | ORAL | 0 refills | Status: DC | PRN

## 2021-02-28 NOTE — Progress Notes (Signed)
   Cheryl Christian is a 40 y.o. female who presents today for a virtual office visit.  Assessment/Plan:  Chronic Problems Addressed Today: Anxiety Not controlled.  No SI or HI.  We discussed treatment options.  She has done well with Zoloft in the past.  We will restart at 50 mg daily.  Can also use hydroxyzine as needed.  Continue buspar 10mg  twice daily.  She will check in with me in a couple weeks and we can adjust dose of Zoloft as needed.  She was on 100 mg daily in the past.  Discussed reasons to return to care.     Subjective:  HPI:  Patient here with worsening anxiety.  Has history of anxiety disorder.  Was on Zoloft in the past.  This was switched to BuSpar for beginning of this year.  She was doing well for period time however symptoms seem to be worsening over the last couple of weeks.  She has occasional anxiety/panic attacks.  She has seen therapy in the past and has been working on .  She is not currently seeing a therapist.  No reported SI or HI.       Objective/Observations  Physical Exam: Gen: NAD, resting comfortably Pulm: Normal work of breathing Neuro: Grossly normal, moves all extremities Psych: Normal affect and thought content  Virtual Visit via Video   I connected with Cheryl Christian on 02/28/21 at  4:00 PM EST by a video enabled telemedicine application and verified that I am speaking with the correct person using two identifiers. The limitations of evaluation and management by telemedicine and the availability of in person appointments were discussed. The patient expressed understanding and agreed to proceed.   Patient location: Home Provider location: [place] 14/01/22 Persons participating in the virtual visit: Myself and Patient     [employer]. Katina Degree, MD 02/28/2021 1:57 PM

## 2021-02-28 NOTE — Assessment & Plan Note (Signed)
Not controlled.  No SI or HI.  We discussed treatment options.  She has done well with Zoloft in the past.  We will restart at 50 mg daily.  Can also use hydroxyzine as needed.  Continue buspar 10mg  twice daily.  She will check in with me in a couple weeks and we can adjust dose of Zoloft as needed.  She was on 100 mg daily in the past.  Discussed reasons to return to care.

## 2021-03-27 ENCOUNTER — Other Ambulatory Visit: Payer: Self-pay | Admitting: Family Medicine

## 2021-05-06 ENCOUNTER — Encounter (HOSPITAL_BASED_OUTPATIENT_CLINIC_OR_DEPARTMENT_OTHER): Payer: Self-pay | Admitting: Obstetrics & Gynecology

## 2021-05-06 ENCOUNTER — Other Ambulatory Visit (HOSPITAL_BASED_OUTPATIENT_CLINIC_OR_DEPARTMENT_OTHER): Payer: Self-pay | Admitting: Obstetrics & Gynecology

## 2021-05-06 DIAGNOSIS — N926 Irregular menstruation, unspecified: Secondary | ICD-10-CM

## 2021-05-06 DIAGNOSIS — N930 Postcoital and contact bleeding: Secondary | ICD-10-CM

## 2021-05-06 MED ORDER — NORETHINDRONE 0.35 MG PO TABS: 1.0000 | ORAL_TABLET | Freq: Every day | ORAL | 1 refills | Status: DC

## 2021-06-01 ENCOUNTER — Other Ambulatory Visit: Payer: Self-pay | Admitting: Family Medicine

## 2021-06-01 DIAGNOSIS — I10 Essential (primary) hypertension: Secondary | ICD-10-CM

## 2021-07-18 ENCOUNTER — Ambulatory Visit (INDEPENDENT_AMBULATORY_CARE_PROVIDER_SITE_OTHER): Payer: 59 | Admitting: Family Medicine

## 2021-07-18 ENCOUNTER — Encounter: Payer: Self-pay | Admitting: Family Medicine

## 2021-07-18 VITALS — BP 131/85 | HR 100 | Temp 98.6°F | Ht 72.0 in | Wt 132.0 lb

## 2021-07-18 DIAGNOSIS — M25511 Pain in right shoulder: Secondary | ICD-10-CM

## 2021-07-18 MED ORDER — MELOXICAM 15 MG PO TABS: 15.0000 mg | ORAL_TABLET | Freq: Every day | ORAL | 0 refills | Status: DC

## 2021-07-18 NOTE — Progress Notes (Signed)
? ?  Cheryl Christian is a 41 y.o. female who presents today for an office visit. ? ?Assessment/Plan:  ? ?Shoulder Pain ?Concern for possible subluxation given her mechanism of injury and physical exam findings.  Is also possible she may have suffered a labral tear as well. May have rotator cuff involvement as well though she has good strength on exam - doubt full thickness tear. it is reassuring that symptoms have been improving the last couple of days.  We discussed diagnostic imaging for definitive diagnosis however we will defer for now.  She would like to try physical therapy for a couple of weeks.  We will also try meloxicam 15 mg daily for couple of weeks.  She will let me know if not proving and we can refer to orthopedics. ? ? ?  ?Subjective:  ?HPI: ? ?Patient here with pain in her right shoulder. She was on vacation in [place] and she slid down carpeted stairs about 2 weeks ago. She reached behind herself to catch herself and immediately noticed pain. She tried using ice with some improvement.  Pain has improved the last several days though still has significant mount of pain with certain motions.  Any sort of overhead activity makes the pain worse.  Hurts to throw.  Hurts to reach across her body.  She has been using ibuprofen with some improvement.  She has noticed a clunking sensation to her 3 times.  No swelling. ? ?   ?  ?Objective:  ?Physical Exam: ?BP 131/85   Pulse 100   Temp 98.6 ?F (37 ?C) (Temporal)   Ht 6' (1.829 m)   Wt 132 lb (59.9 kg)   LMP 07/03/2021   SpO2 99%   BMI 17.90 kg/m?   ?Gen: No acute distress, resting comfortably ?MSK: ?- Right Shoulder: No deformities.  Biceps tendon nontender to palpation.  Slight pain with resisted supraspinatus and resisted external rotation.  No pain with internal rotation.  No significant weakness noted.  Positive Neer and Hawkin.  Slight pain with Obrien test. Occasional clicking sensation noted with passive range of motion.  Neurovascular  intact distally ?Neuro: Grossly normal, moves all extremities.  ?Psych: Normal affect and thought content ? ?   ? ?Katina Degree. Jimmey Ralph, MD ?07/18/2021 9:50 AM  ?

## 2021-07-18 NOTE — Patient Instructions (Signed)
It was very nice to see you today! ? ?I am concerned you may have had a subluxation in your shoulder.  It is also possible that you could have a slight labral tear as well.  We will have you see a physical therapist.  If it is not improving over the next few weeks we will have you see an orthopedist.  Please take the meloxicam daily for the next 1 to 2 weeks and let her know if not improving. ? ?Take care, ?Dr Jerline Pain ? ?PLEASE NOTE: ? ?If you had any lab tests please let us know if you have not heard back within a few days. You may see your results on mychart before we have a chance to review them but we will give you a call once they are reviewed by [place]. If we ordered any referrals today, please let us know if you have not heard from their office within the next week.  ? ?Please try these tips to maintain a healthy lifestyle: ? ?Eat at least 3 REAL meals and 1-2 snacks per day.  Aim for no more than 5 hours between eating.  If you eat breakfast, please do so within one hour of getting up.  ? ?Each meal should contain half fruits/vegetables, one quarter protein, and one quarter carbs (no bigger than a computer mouse) ? ?Cut down on sweet beverages. This includes juice, soda, and sweet tea.  ? ?Drink at least 1 glass of water with each meal and aim for at least 8 glasses per day ? ?Exercise at least 150 minutes every week.   ?

## 2021-07-22 NOTE — Therapy (Signed)
?OUTPATIENT PHYSICAL THERAPY SHOULDER EVALUATION ? ? ?Patient Name: Cheryl Christian ?MRN: [id number] ?DOB:05-16-1980, 41 y.o., female ?Today's Date: 07/23/2021 ? ? PT End of Session - 07/23/21 1106   ? ? Visit Number 1   ? Number of Visits 12   ? Date for PT Re-Evaluation 09/03/21   ? Authorization Type UHC 3 left out of 60   ? Authorization - Visit Number 1   ? Authorization - Number of Visits 3   ? Progress Note Due on Visit 10   ? PT Start Time 1106   ? PT Stop Time 1139   ? PT Time Calculation (min) 33 min   ? ?  ?  ? ?  ? ? ?Past Medical History:  ?Diagnosis Date  ? Essential hypertension 06/22/2018  ? Hypothyroid 05/20/2018  ? 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 Preeclampsia   ? ?Past Surgical History:  ?Procedure Laterality Date  ? EXCISION MASS ABDOMINAL Right 04/12/2019  ? Procedure: EXCISION ABDOMINAL WALL MASS;  Surgeon: Emelia Loron, MD;  Location: [facility];  Service: General;  Laterality: Right;  GENERAL AND TAP BLOCK ANESTHESIA  ? WISDOM TOOTH EXTRACTION    ? 41 yo  ? ?Patient Active Problem List  ? Diagnosis Date Noted  ? Irregular bleeding 11/29/2020  ? Postcoital bleeding 11/29/2020  ? Other endometriosis 10/04/2020  ? Anxiety 10/04/2020  ? Skin lesion 10/04/2020  ? Verruca 01/27/2020  ? Essential hypertension 06/22/2018  ? Hypothyroid 05/20/2018  ? ? ?PCP: Ardith Dark, MD ? ?REFERRING PROVIDER: Ardith Dark, MD ? ?REFERRING DIAG: M25.511 (ICD-10-CM) - Acute pain of right shoulder ? ?THERAPY DIAG:  ?Acute pain of right shoulder ? ? ?ONSET DATE: 07/05/21 DOI ? ?SUBJECTIVE:                                                                                                                                                                                     ? ?SUBJECTIVE STATEMENT: ?States that she sleeps on her side and that bothers her, States certain options bothers her. States that she cant put sunscreen on her other arm. States she likes to do dance  workouts but she can't use her arms like she would. States that she also has a weight training ap and would like to return to PLOF. ? ? ?Subjective taken from MD visit ?Patient here with pain in her right shoulder. She was on vacation in [place] and she slid down carpeted stairs about 2 weeks ago. She reached behind herself to catch herself and immediately noticed pain. She tried using ice with some improvement.  Pain has improved the last several days though  still has significant mount of pain with certain motions.  Any sort of overhead activity makes the pain worse.  Hurts to throw.  Hurts to reach across her body.  She has been using ibuprofen with some improvement.  She has noticed a clunking sensation to her 3 times.  No swelling. Has started meloxicam ?  ? ?PERTINENT HISTORY: ?Abdomen surgery 2020, HTN ? ?PAIN:  ?Are you having pain? Yes: NPRS scale: current 1, at worst 7/10 ?Pain location: right posterior shoulder pain ?Pain description: sharp ?Aggravating factors: movement, side lying ?Relieving factors: rest ? ?PRECAUTIONS: None ? ?WEIGHT BEARING RESTRICTIONS No ? ?FALLS:  ?Has patient fallen in last 6 months? Yes. Number of falls 1 accident downstairs ? ?LIVING ENVIRONMENT: ?Lives with: lives with their family ?Lives in: House/apartment ? 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 ?PLOF: Independent ? ?PATIENT GOALS regain ROM and return to PLOF  ? ?OBJECTIVE:  ? ?DIAGNOSTIC FINDINGS:  ?None at this time ? ? ?COGNITION: ? Overall cognitive status: Within functional limits for tasks assessed ?    ?SENSATION: ?WFL ? ?POSTURE: ?Rounded shoulders, slumped seated posture ? ? UE Measurements ?Upper Extremity Right ?07/23/2021 Left ?07/23/2021  ? A/PROM MMT A/PROM MMT  ?Shoulder Flexion WFL 4+ WFL 4+  ?Shoulder Extension      ?Shoulder Abduction WFL* 4+ WFL   ?Shoulder Adduction      ?Shoulder Internal Rotation WFL 4+ WFL 4+  ?Shoulder External Rotation WFL* 4+ WFL 4+  ?Elbow Flexion  5  5  ?Elbow  Extension  5  5  ?Wrist Flexion      ?Wrist Extension      ?Wrist Supination      ?Wrist Pronation      ?Wrist Ulnar Deviation      ?Wrist Radial Deviation      ?Grip Strength NA 90# NA 80#  ?  (Blank rows = not tested) ?  * pain on posterior sholuder ? ? ?SHOULDER SPECIAL TESTS: ?Negative lift off bilaterally, + apprehension test on right ? ?JOINT MOBILITY TESTING:  ?Hypomobility noted in right shoulder  ? ?PALPATION:  ?Increased resting tone along posterior shoulder right but no pain. ?  ?TODAY'S TREATMENT:  ?07/23/2021 ? ?Therapeutic Exercise: ? Aerobic: ?Supine: AAROM flexion dowel 2 minutes, shoulder ER 2 minutes ?Prone: ? Seated: ? Standing: ?Neuromuscular Re-education: ?Manual Therapy: ?Therapeutic Activity: ?Self Care: ?Trigger Point Dry Needling:  ?Modalities:  ? ? ?PATIENT EDUCATION:  ?Education details: on current presentation, on HEP, on clinical outcomes score and POC ?Person educated: Patient ?Education method: Explanation, Demonstration, and Handouts ?Education comprehension: verbalized understanding ? ? ? ?HOME EXERCISE PROGRAM: ?[id number] ? ?ASSESSMENT: ? ?CLINICAL IMPRESSION: ?Patient is a 41 y.o. female who was seen today for physical therapy evaluation and treatment for right shoulder pain after a fall with hand outstretched behind her. Patient presents with anterior instability and reduced ability to use arm throughout the day. Patient would greatly benefit from skilled PT to improve overall function and QOL.Marland Kitchen.  ? ? ?OBJECTIVE IMPAIRMENTS decreased activity tolerance, decreased mobility, decreased ROM, decreased strength, postural dysfunction, and pain.  ? ?ACTIVITY LIMITATIONS  working out, putting sunscreen on .  ? ?PERSONAL FACTORS Fitness are also affecting patient's functional outcome.  ? ? ?REHAB POTENTIAL: Excellent ? ?CLINICAL DECISION MAKING: Stable/uncomplicated ? ?EVALUATION COMPLEXITY: High ? ? ?GOALS: ?Goals reviewed with patient?  yes ? ?SHORT TERM GOALS: ? ?Patient will be  independent in self management strategies to improve quality of life and functional outcomes. ?  Baseline: new program ?Target date: 08/13/2021 ?Goal status: INITIAL ? ?2.  Patient will report at least 50% improvement in overall symptoms and/or function to demonstrate improved functional mobility ?Baseline: 0% ?Target date: 08/13/2021 ?Goal status: INITIAL ? ?3.  Patient will be able to reach across body and put on sunscreen without pain ?Baseline: unable ?Target date: 08/13/2021 ?Goal status: INITIAL ? ? ? ? ? ? ?LONG TERM GOALS: ? ?Patient will report at least 75% improvement in overall symptoms and/or function to demonstrate improved functional mobility ?Baseline: 0% ?Target date: 09/03/2021 ?Goal status: INITIAL ? ?2.  Patient will be able to demonstrate painfree ROM in right shoulder ?Baseline: unable ?Target date: 09/03/2021 ?Goal status: INITIAL ? ?3.  Patient will be able to tolerate lifting weights and dance routine without pain to return to PLOF ?Baseline: unable ?Target date: 09/03/2021 ?Goal status: INITIAL ? ? ? ? ? ?PLAN: ?PT FREQUENCY: 2x/week ? ?PT DURATION: 6 weeks ? ?PLANNED INTERVENTIONS: Therapeutic exercises, Therapeutic activity, Neuromuscular re-education, Balance training, Gait training, Patient/Family education, Joint manipulation, Joint mobilization, Dry Needling, Cognitive remediation, Electrical stimulation, Spinal manipulation, Spinal mobilization, Cryotherapy, Moist heat, Ultrasound, Ionotophoresis 4mg /ml Dexamethasone, and Manual therapy ? ?PLAN FOR NEXT SESSION: stabilization exercises, painfree shoulder motion, trial CKC ? ? ?12:11 PM, 07/23/21 ?07/25/21, DPT ?Physical Therapy with [facility] ?[facility]  ?[phone] office ? ?

## 2021-07-23 ENCOUNTER — Ambulatory Visit (INDEPENDENT_AMBULATORY_CARE_PROVIDER_SITE_OTHER): Payer: 59 | Admitting: Physical Therapy

## 2021-07-23 ENCOUNTER — Encounter: Payer: Self-pay | Admitting: Physical Therapy

## 2021-07-23 DIAGNOSIS — M25511 Pain in right shoulder: Secondary | ICD-10-CM | POA: Diagnosis not present

## 2021-07-23 DIAGNOSIS — M25611 Stiffness of right shoulder, not elsewhere classified: Secondary | ICD-10-CM

## 2021-07-24 ENCOUNTER — Encounter: Payer: Self-pay | Admitting: Physical Therapy

## 2021-07-24 ENCOUNTER — Ambulatory Visit (INDEPENDENT_AMBULATORY_CARE_PROVIDER_SITE_OTHER): Payer: 59 | Admitting: Physical Therapy

## 2021-07-24 DIAGNOSIS — M25511 Pain in right shoulder: Secondary | ICD-10-CM

## 2021-07-24 DIAGNOSIS — M25611 Stiffness of right shoulder, not elsewhere classified: Secondary | ICD-10-CM | POA: Diagnosis not present

## 2021-07-24 NOTE — Therapy (Signed)
?OUTPATIENT PHYSICAL THERAPY TREATMENT NOTE ? ? ?Patient Name: Cheryl ContrasJulie Milligan Christian ?MRN: [id number] ?DOB:06/22/1980, 41 y.o., female ?Today's Date: 07/24/2021 ? ?PCP: Cheryl Christian, Caleb M, MD ?REFERRING PROVIDER: Ardith Christian, Caleb M, MD ? ?END OF SESSION:  ? PT End of Session - 07/24/21 1215   ? ? Visit Number 2   ? Number of Visits 12   ? Date for PT Re-Evaluation 09/03/21   ? Authorization Type UHC 3 left out of 60   ? Authorization - Visit Number 2   ? Authorization - Number of Visits 3   ? Progress Note Due on Visit 10   ? PT Start Time 1215   ? PT Stop Time 1255   ? PT Time Calculation (min) 40 min   ? ?  ?  ? ?  ? ? ?Past Medical History:  ?Diagnosis Date  ? Essential hypertension 06/22/2018  ? Hypothyroid 05/20/2018  ? 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 Preeclampsia   ? ?Past Surgical History:  ?Procedure Laterality Date  ? EXCISION MASS ABDOMINAL Right 04/12/2019  ? Procedure: EXCISION ABDOMINAL WALL MASS;  Surgeon: Emelia LoronWakefield, Matthew, MD;  Location: [facility];  Service: General;  Laterality: Right;  GENERAL AND TAP BLOCK ANESTHESIA  ? WISDOM TOOTH EXTRACTION    ? 41 yo  ? ?Patient Active Problem List  ? Diagnosis Date Noted  ? Irregular bleeding 11/29/2020  ? Postcoital bleeding 11/29/2020  ? Other endometriosis 10/04/2020  ? Anxiety 10/04/2020  ? Skin lesion 10/04/2020  ? Verruca 01/27/2020  ? Essential hypertension 06/22/2018  ? Hypothyroid 05/20/2018  ? ? ?  ?PCP: Cheryl Christian, Caleb M, MD ?  ?REFERRING PROVIDER: Ardith Christian, Caleb M, MD ?  ?REFERRING DIAG: M25.511 (ICD-10-CM) - Acute pain of right shoulder ?  ?THERAPY DIAG:  ?Acute pain of right shoulder ?  ?  ?ONSET DATE: 07/05/21 DOI ?  ?SUBJECTIVE:                                                                                                                                                                                     ?  ?SUBJECTIVE STATEMENT: ?07/24/2021 ?States she did her exercises this morning and had a little bit more residual  soreness afterwards.  ? ?Eval: ?States that she sleeps on her side and that bothers her, States certain options bothers her. States that she cant put sunscreen on her other arm. States she likes to do dance workouts but she can't use her arms like she would. States that she also has a weight training ap and would like to return to PLOF. ?  ?  ?Subjective taken from MD visit ?Patient here with pain  in her right shoulder. She was on vacation in [place] and she slid down carpeted stairs about 2 weeks ago. She reached behind herself to catch herself and immediately noticed pain. She tried using ice with some improvement.  Pain has improved the last several days though still has significant mount of pain with certain motions.  Any sort of overhead activity makes the pain worse.  Hurts to throw.  Hurts to reach across her body.  She has been using ibuprofen with some improvement.  She has noticed a clunking sensation to her 3 times.  No swelling. Has started meloxicam ?  ?  ?PERTINENT HISTORY: ?Abdomen surgery 2020, HTN ?  ?PAIN:  ?Are you having pain? Yes: NPRS scale: current 1, ?Pain location: right posterior shoulder pain ?Pain description: sharp ?Aggravating factors: movement, side lying ?Relieving factors: rest ?  ?PRECAUTIONS: None ?  ?WEIGHT BEARING RESTRICTIONS No ?  ?FALLS:  ?Has patient fallen in last 6 months? Yes. Number of falls 1 accident downstairs ?  ?LIVING ENVIRONMENT: ?Lives with: lives with their family ?Lives in: House/apartment ?  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  ?PLOF: Independent ?  ?PATIENT GOALS regain ROM and return to PLOF  ?  ?OBJECTIVE:  ?  ?DIAGNOSTIC FINDINGS:  ?None at this time ?  ?  ?COGNITION: ?          Overall cognitive status: Within functional limits for tasks assessed ?                                 ?SENSATION: ?WFL ?  ?POSTURE: ?Rounded shoulders, slumped seated posture ?  ?           UE Measurements ?      ?Upper Extremity Right ?07/23/2021  Left ?07/23/2021  ?  A/PROM MMT A/PROM MMT  ?Shoulder Flexion WFL 4+ WFL 4+  ?Shoulder Extension          ?Shoulder Abduction WFL* 4+ WFL    ?Shoulder Adduction          ?Shoulder Internal Rotation WFL 4+ WFL 4+  ?Shoulder External Rotation WFL* 4+ WFL 4+  ?Elbow Flexion   5   5  ?Elbow Extension   5   5  ?Wrist Flexion          ?Wrist Extension          ?Wrist Supination          ?Wrist Pronation          ?Wrist Ulnar Deviation          ?Wrist Radial Deviation          ?Grip Strength NA 90# NA 80#  ?                      (Blank rows = not tested) ?                      * pain on posterior sholuder ?  ?  ?SHOULDER SPECIAL TESTS: ?Negative lift off bilaterally, + apprehension test on right ?  ?JOINT MOBILITY TESTING:  ?Hypomobility noted in right shoulder  ?  ?PALPATION:  ?Increased resting tone along posterior shoulder right but no pain. ?            ?TODAY'S TREATMENT:  ?07/24/2021 ?  ?Therapeutic Exercise: ?  Aerobic: ?Supine: AAROM flexion dowel 2 minutes, shoulder ER 2 minutes ?Prone: ?           Seated: ? Quadruped: rocking back and forth and lateral 1 minute each 2x each, shoulder taps alternating 2x15. Minutes bilaterally ?           Standing: ball stabilization at wall yellow ball up/down and lateral x25 R, x10 Left in both flexion and abd ? ?Neuromuscular Re-education: ?Manual Therapy: ?Therapeutic Activity: ?Self Care: ?Trigger Point Dry Needling:  ?Modalities:  ?  ?  ?PATIENT EDUCATION:  ?Education details: on HEP ?Person educated: Patient ?Education method: Explanation, Demonstration, and Handouts ?Education comprehension: verbalized understanding ?  ?  ?  ?HOME EXERCISE PROGRAM: ?[id number] ?  ?ASSESSMENT: ?  ?CLINICAL IMPRESSION: ?07/24/2021 ?Tolerated all new exercises well. No pain noted. Reviewed previous exercises for form and able to perform all exercises well with good form by end of session. No increase in pain noted end of session but fatigue in shoulder. ? ?Eval:Patient is a 41 y.o. female  who was seen today for physical therapy evaluation and treatment for right shoulder pain after a fall with hand outstretched behind her. Patient presents with anterior instability and reduced ability to use arm throughout the day. Patient would greatly benefit from skilled PT to improve overall function and QOL.Marland Kitchen  ?  ?  ?OBJECTIVE IMPAIRMENTS decreased activity tolerance, decreased mobility, decreased ROM, decreased strength, postural dysfunction, and pain.  ?  ?ACTIVITY LIMITATIONS  working out, putting sunscreen on .  ?  ?PERSONAL FACTORS Fitness are also affecting patient's functional outcome.  ?  ?  ?REHAB POTENTIAL: Excellent ?  ?CLINICAL DECISION MAKING: Stable/uncomplicated ?  ?EVALUATION COMPLEXITY: High ?  ?  ?GOALS: ?Goals reviewed with patient?  yes ?  ?SHORT TERM GOALS: ?  ?Patient will be independent in self management strategies to improve quality of life and functional outcomes. ?Baseline: new program ?Target date: 08/13/2021 ?Goal status: INITIAL ?  ?2.  Patient will report at least 50% improvement in overall symptoms and/or function to demonstrate improved functional mobility ?Baseline: 0% ?Target date: 08/13/2021 ?Goal status: INITIAL ?  ?3.  Patient will be able to reach across body and put on sunscreen without pain ?Baseline: unable ?Target date: 08/13/2021 ?Goal status: INITIAL ?  ?  ?  ?  ?  ?  ?LONG TERM GOALS: ?  ?Patient will report at least 75% improvement in overall symptoms and/or function to demonstrate improved functional mobility ?Baseline: 0% ?Target date: 09/03/2021 ?Goal status: INITIAL ?  ?2.  Patient will be able to demonstrate painfree ROM in right shoulder ?Baseline: unable ?Target date: 09/03/2021 ?Goal status: INITIAL ?  ?3.  Patient will be able to tolerate lifting weights and dance routine without pain to return to PLOF ?Baseline: unable ?Target date: 09/03/2021 ?Goal status: INITIAL ?  ?  ?  ?  ?  ?PLAN: ?PT FREQUENCY: 2x/week ?  ?PT DURATION: 6 weeks ?  ?PLANNED INTERVENTIONS:  Therapeutic exercises, Therapeutic activity, Neuromuscular re-education, Balance training, Gait training, Patient/Family education, Joint manipulation, Joint mobilization, Dry Needling, Cognitive remediation, Elect

## 2021-07-30 ENCOUNTER — Encounter: Payer: Self-pay | Admitting: Physical Therapy

## 2021-07-30 ENCOUNTER — Ambulatory Visit (INDEPENDENT_AMBULATORY_CARE_PROVIDER_SITE_OTHER): Payer: 59 | Admitting: Physical Therapy

## 2021-07-30 DIAGNOSIS — M25511 Pain in right shoulder: Secondary | ICD-10-CM | POA: Diagnosis not present

## 2021-07-30 DIAGNOSIS — M25611 Stiffness of right shoulder, not elsewhere classified: Secondary | ICD-10-CM | POA: Diagnosis not present

## 2021-07-30 NOTE — Therapy (Signed)
?OUTPATIENT PHYSICAL THERAPY TREATMENT NOTE ? ? ?Patient Name: Cheryl Christian ?MRN: [id number] ?DOB:07/16/1980, 41 y.o., female ?Today's Date: 07/30/2021 ? ?PCP: Ardith DarkParker, Caleb M, MD ?REFERRING PROVIDER: Ardith DarkParker, Caleb M, MD ? ?END OF SESSION:  ? PT End of Session - 07/30/21 1247   ? ? Visit Number 3   ? Number of Visits 12   ? Date for PT Re-Evaluation 09/03/21   ? Authorization Type UHC 3 left out of 60   ? Authorization - Visit Number 3   ? Authorization - Number of Visits 3   ? Progress Note Due on Visit 10   ? PT Start Time 0848   ? PT Stop Time 0930   ? PT Time Calculation (min) 42 min   ? Activity Tolerance Patient tolerated treatment well   ? Behavior [facility] for tasks assessed/performed   ? ?  ?  ? ?  ? ? ? ?Past Medical History:  ?Diagnosis Date  ? Essential hypertension 06/22/2018  ? Hypothyroid 05/20/2018  ? 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 Preeclampsia   ? ?Past Surgical History:  ?Procedure Laterality Date  ? EXCISION MASS ABDOMINAL Right 04/12/2019  ? Procedure: EXCISION ABDOMINAL WALL MASS;  Surgeon: Emelia LoronWakefield, Matthew, MD;  Location: [facility];  Service: General;  Laterality: Right;  GENERAL AND TAP BLOCK ANESTHESIA  ? WISDOM TOOTH EXTRACTION    ? 41 yo  ? ?Patient Active Problem List  ? Diagnosis Date Noted  ? Irregular bleeding 11/29/2020  ? Postcoital bleeding 11/29/2020  ? Other endometriosis 10/04/2020  ? Anxiety 10/04/2020  ? Skin lesion 10/04/2020  ? Verruca 01/27/2020  ? Essential hypertension 06/22/2018  ? Hypothyroid 05/20/2018  ? ? ?  ?PCP: Ardith DarkParker, Caleb M, MD ?  ?REFERRING PROVIDER: Ardith DarkParker, Caleb M, MD ?  ?REFERRING DIAG: M25.511 (ICD-10-CM) - Acute pain of right shoulder ?  ?THERAPY DIAG:  ?Acute pain of right shoulder ?  ?  ?ONSET DATE: 07/05/21 DOI ?  ?SUBJECTIVE:                                                                                                                                                                                      ?  ?SUBJECTIVE STATEMENT: ?07/30/2021 ?States doing quite well, minimal soreness in shoulder in last few days.  ? ?Eval: ?States that she sleeps on her side and that bothers her, States certain options bothers her. States that she cant put sunscreen on her other arm. States she likes to do dance workouts but she can't use her arms like she would. States that she also has a weight training ap and would like to  return to PLOF. ?  ?  ?Subjective taken from MD visit ?Patient here with pain in her right shoulder. She was on vacation in [place] and she slid down carpeted stairs about 2 weeks ago. She reached behind herself to catch herself and immediately noticed pain. She tried using ice with some improvement.  Pain has improved the last several days though still has significant mount of pain with certain motions.  Any sort of overhead activity makes the pain worse.  Hurts to throw.  Hurts to reach across her body.  She has been using ibuprofen with some improvement.  She has noticed a clunking sensation to her 3 times.  No swelling. Has started meloxicam ?  ?  ?PERTINENT HISTORY: ?Abdomen surgery 2020, HTN ?  ?PAIN:  ?Are you having pain? Yes: NPRS scale: current 1, ?Pain location: right posterior shoulder pain ?Pain description: sharp ?Aggravating factors: movement, side lying ?Relieving factors: rest ?  ?PRECAUTIONS: None ?  ?WEIGHT BEARING RESTRICTIONS No ?  ?FALLS:  ?Has patient fallen in last 6 months? Yes. Number of falls 1 accident downstairs ?  ?LIVING ENVIRONMENT: ?Lives with: lives with their family ?Lives in: House/apartment ?  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  ?PLOF: Independent ?  ?PATIENT GOALS regain ROM and return to PLOF  ?  ?OBJECTIVE:  ?  ?DIAGNOSTIC FINDINGS:  ?None at this time ?  ?  ?COGNITION: ?          Overall cognitive status: Within functional limits for tasks assessed ?                                 ?SENSATION: ?WFL ?  ?POSTURE: ?Rounded shoulders, slumped seated  posture ?  ?           UE Measurements ?      ?Upper Extremity Right ?07/23/2021 Left ?07/23/2021  ?  A/PROM MMT A/PROM MMT  ?Shoulder Flexion WFL 4+ WFL 4+  ?Shoulder Extension          ?Shoulder Abduction WFL* 4+ WFL    ?Shoulder Adduction          ?Shoulder Internal Rotation WFL 4+ WFL 4+  ?Shoulder External Rotation WFL* 4+ WFL 4+  ?Elbow Flexion   5   5  ?Elbow Extension   5   5  ?Wrist Flexion          ?Wrist Extension          ?Wrist Supination          ?Wrist Pronation          ?Wrist Ulnar Deviation          ?Wrist Radial Deviation          ?Grip Strength NA 90# NA 80#  ?                      (Blank rows = not tested) ?                      * pain on posterior sholuder ?  ?  ?SHOULDER SPECIAL TESTS: ?Negative lift off bilaterally, + apprehension test on right ?  ?JOINT MOBILITY TESTING:  ?Hypomobility noted in right shoulder  ?  ?PALPATION:  ?Increased resting tone along posterior shoulder right but no pain. ?            ?TODAY'S TREATMENT:  ?  07/30/2021 ?Therapeutic Exercise: ?           Aerobic: ?Supine: AAROM flexion dowel 2 minutes,  ?Prone: ?           Seated: pulley flex and scaption x 1 min ea;  ? Quadruped: SA presses 2x10;  Alternating fwd flexion x 10;  ?           Standing: ball stabilization at wall yellow ball up/down and lateral x25 R,/ flexion ;  Rows GTB 2x10;  Bil ER RTB 2x10; scap stabs at wall, RTB box x 2 min;   ?Neuromuscular Re-education: ?Manual Therapy: ?Therapeutic Activity: ?Self Care: ?Trigger Point Dry Needling:  ?Modalities:  ?  ?  ?PATIENT EDUCATION:  ?Education details: reviewed HEP ?Person educated: Patient ?Education method: Explanation, Demonstration, and Handouts ?Education comprehension: verbalized understanding ?  ?  ?  ?HOME EXERCISE PROGRAM: ?[id number] ?  ?ASSESSMENT: ?  ?CLINICAL IMPRESSION: ?07/30/2021 ?Pt with minimal pain in shoulder with ROM testing today. She was able to progress strengthening for scapular muscles and shoulder girdle with good tolerance. Plan to progress  as able. Discussed continuing light activity, and not over doing activity at this time.  ? ?Eval:Patient is a 41 y.o. female who was seen today for physical therapy evaluation and treatment for right shoulder pain after a fall with hand outstretched behind her. Patient presents with anterior instability and reduced ability to use arm throughout the day. Patient would greatly benefit from skilled PT to improve overall function and QOL.Marland Kitchen  ?  ?  ?OBJECTIVE IMPAIRMENTS decreased activity tolerance, decreased mobility, decreased ROM, decreased strength, postural dysfunction, and pain.  ?  ?ACTIVITY LIMITATIONS  working out, putting sunscreen on .  ?  ?PERSONAL FACTORS Fitness are also affecting patient's functional outcome.  ?  ?  ?REHAB POTENTIAL: Excellent ?  ?CLINICAL DECISION MAKING: Stable/uncomplicated ?  ?EVALUATION COMPLEXITY: High ?  ?  ?GOALS: ?Goals reviewed with patient?  yes ?  ?SHORT TERM GOALS: ?  ?Patient will be independent in self management strategies to improve quality of life and functional outcomes. ?Baseline: new program ?Target date: 08/13/2021 ?Goal status: INITIAL ?  ?2.  Patient will report at least 50% improvement in overall symptoms and/or function to demonstrate improved functional mobility ?Baseline: 0% ?Target date: 08/13/2021 ?Goal status: INITIAL ?  ?3.  Patient will be able to reach across body and put on sunscreen without pain ?Baseline: unable ?Target date: 08/13/2021 ?Goal status: INITIAL ?  ?  ?  ?  ?  ?  ?LONG TERM GOALS: ?  ?Patient will report at least 75% improvement in overall symptoms and/or function to demonstrate improved functional mobility ?Baseline: 0% ?Target date: 09/03/2021 ?Goal status: INITIAL ?  ?2.  Patient will be able to demonstrate painfree ROM in right shoulder ?Baseline: unable ?Target date: 09/03/2021 ?Goal status: INITIAL ?  ?3.  Patient will be able to tolerate lifting weights and dance routine without pain to return to PLOF ?Baseline: unable ?Target date:  09/03/2021 ?Goal status: INITIAL ?  ?  ?  ?  ?  ?PLAN: ?PT FREQUENCY: 2x/week ?  ?PT DURATION: 6 weeks ?  ?PLANNED INTERVENTIONS: Therapeutic exercises, Therapeutic activity, Neuromuscular re-education, Balan

## 2021-08-01 ENCOUNTER — Ambulatory Visit (INDEPENDENT_AMBULATORY_CARE_PROVIDER_SITE_OTHER): Payer: 59 | Admitting: Physical Therapy

## 2021-08-01 ENCOUNTER — Encounter: Payer: Self-pay | Admitting: Physical Therapy

## 2021-08-01 DIAGNOSIS — M25511 Pain in right shoulder: Secondary | ICD-10-CM

## 2021-08-01 DIAGNOSIS — M25611 Stiffness of right shoulder, not elsewhere classified: Secondary | ICD-10-CM

## 2021-08-01 NOTE — Therapy (Addendum)
?OUTPATIENT PHYSICAL THERAPY TREATMENT NOTE ? ? ?Patient Name: Cheryl Christian ?MRN: [id number] ?DOB:[birth year], 41 y.o., female ?Today's Date: 08/01/2021 ? ?PCP: Ardith Dark, MD ?REFERRING PROVIDER: Ardith Dark, MD ? ?END OF SESSION:  ? PT End of Session - 08/01/21 1648   ? ? Visit Number 4   ? Number of Visits 12   ? Date for PT Re-Evaluation 09/03/21   ? Authorization Type UHC 3 left out of 60   ? Authorization - Visit Number 3   ? Authorization - Number of Visits 3   ? Progress Note Due on Visit 10   ? PT Start Time 1604   ? PT Stop Time 1644   ? PT Time Calculation (min) 40 min   ? Activity Tolerance Patient tolerated treatment well   ? Behavior During Therapy [facility] for tasks assessed/performed   ? ?  ?  ? ?  ? ? ? ? ?Past Medical History:  ?Diagnosis Date  ? Essential hypertension 06/22/2018  ? Hypothyroid 05/20/2018  ? 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 Preeclampsia   ? ?Past Surgical History:  ?Procedure Laterality Date  ? EXCISION MASS ABDOMINAL Right 04/12/2019  ? Procedure: EXCISION ABDOMINAL WALL MASS;  Surgeon: Emelia Loron, MD;  Location: [facility];  Service: General;  Laterality: Right;  GENERAL AND TAP BLOCK ANESTHESIA  ? WISDOM TOOTH EXTRACTION    ? 41 yo  ? ?Patient Active Problem List  ? Diagnosis Date Noted  ? Irregular bleeding 11/29/2020  ? Postcoital bleeding 11/29/2020  ? Other endometriosis 10/04/2020  ? Anxiety 10/04/2020  ? Skin lesion 10/04/2020  ? Verruca 01/27/2020  ? Essential hypertension 06/22/2018  ? Hypothyroid 05/20/2018  ? ? ?  ?PCP: Ardith Dark, MD ?  ?REFERRING PROVIDER: Ardith Dark, MD ?  ?REFERRING DIAG: M25.511 (ICD-10-CM) - Acute pain of right shoulder ?  ?THERAPY DIAG:  ?Acute pain of right shoulder ?  ?  ?ONSET DATE: 07/05/21 DOI ?  ?SUBJECTIVE:                                                                                                                                                                                      ?  ?SUBJECTIVE STATEMENT: ?08/01/2021 ?States doing quite well, no soreness in shoulder in last 2 days. Doing better with behind back and cross arm motions.  ? ?Eval: ?States that she sleeps on her side and that bothers her, States certain options bothers her. States that she cant put sunscreen on her other arm. States she likes to do dance workouts but she can't use her arms like she would. States that she  also has a weight training ap and would like to return to PLOF. ?  ?  ?Subjective taken from MD visit ?Patient here with pain in her right shoulder. She was on vacation in [place] and she slid down carpeted stairs about 2 weeks ago. She reached behind herself to catch herself and immediately noticed pain. She tried using ice with some improvement.  Pain has improved the last several days though still has significant mount of pain with certain motions.  Any sort of overhead activity makes the pain worse.  Hurts to throw.  Hurts to reach across her body.  She has been using ibuprofen with some improvement.  She has noticed a clunking sensation to her 3 times.  No swelling. Has started meloxicam ?  ?  ?PERTINENT HISTORY: ?Abdomen surgery 2020, HTN ?  ?PAIN:  ?Are you having pain? Yes: NPRS scale: current 1, ?Pain location: right posterior shoulder pain ?Pain description: sharp ?Aggravating factors: movement, side lying ?Relieving factors: rest ?  ?PRECAUTIONS: None ?  ?WEIGHT BEARING RESTRICTIONS No ?  ?FALLS:  ?Has patient fallen in last 6 months? Yes. Number of falls 1 accident downstairs ?  ?LIVING ENVIRONMENT: ?Lives with: lives with their family ?Lives in: House/apartment ?  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  ?PLOF: Independent ?  ?PATIENT GOALS regain ROM and return to PLOF  ?  ?OBJECTIVE:  ?  ?DIAGNOSTIC FINDINGS:  ?None at this time ?  ?COGNITION: ?          Overall cognitive status: Within functional limits for tasks assessed ?                                 ?SENSATION: ?WFL ?   ?POSTURE: ?Rounded shoulders, slumped seated posture ?  ?           UE Measurements ?      ?Upper Extremity Right ?07/23/2021 Left ?07/23/2021  ?  A/PROM MMT A/PROM MMT  ?Shoulder Flexion WFL 4+ WFL 4+  ?Shoulder Extension          ?Shoulder Abduction WFL* 4+ WFL    ?Shoulder Adduction          ?Shoulder Internal Rotation WFL 4+ WFL 4+  ?Shoulder External Rotation WFL* 4+ WFL 4+  ?Elbow Flexion   5   5  ?Elbow Extension   5   5  ?Wrist Flexion          ?Wrist Extension          ?Wrist Supination          ?Wrist Pronation          ?Wrist Ulnar Deviation          ?Wrist Radial Deviation          ?Grip Strength NA 90# NA 80#  ?                      (Blank rows = not tested) ?                      * pain on posterior sholuder ?  ?  ?SHOULDER SPECIAL TESTS: ?Negative lift off bilaterally, + apprehension test on right ?  ?JOINT MOBILITY TESTING:  ?Hypomobility noted in right shoulder  ?  ?PALPATION:  ?Increased resting tone along posterior shoulder right but no pain. ?            ?  TODAY'S TREATMENT:  ?08/01/2021 ?Therapeutic Exercise: ?           Aerobic: ?Supine:   ?Prone:  ?           Seated: pulley flex and scaption x 1 min ea;  ? Quadruped: L/R weight shifts x 10; SA presses 2x10;  Alternating fwd flexion x 15;  ?           Standing: ball stabilization at wall yellow ball up/down and lateral x25 R,/ flexion ;  Rows GTB 2x10;  Bil ER RTB 2x10; scap stabs at wall, RTB box x 2 min;   ?Neuromuscular Re-education: ?Manual Therapy: ? ?  ?  ?PATIENT EDUCATION:  ?Education details: reviewed HEP ?Person educated: Patient ?Education method: Explanation, Demonstration, and Handouts ?Education comprehension: verbalized understanding ?  ?  ?  ?HOME EXERCISE PROGRAM: ?[id number] ?  ?ASSESSMENT: ?  ?CLINICAL IMPRESSION: ?08/01/2021 ?Pt with no pain in shoulder today. Improved ROM and pain for behind the back IR/ER.  She was able to progress strengthening without pain, and minimal fatigue. Pt making good progress.  ? ? ?Eval:Patient is a 41  y.o. female who was seen today for physical therapy evaluation and treatment for right shoulder pain after a fall with hand outstretched behind her. Patient presents with anterior instability and reduced ability to use arm throughout the day. Patient would greatly benefit from skilled PT to improve overall function and QOL.Marland Kitchen  ?  ?  ?OBJECTIVE IMPAIRMENTS decreased activity tolerance, decreased mobility, decreased ROM, decreased strength, postural dysfunction, and pain.  ?  ?ACTIVITY LIMITATIONS  working out, putting sunscreen on .  ?  ?PERSONAL FACTORS Fitness are also affecting patient's functional outcome.  ?  ?  ?REHAB POTENTIAL: Excellent ?  ?CLINICAL DECISION MAKING: Stable/uncomplicated ?  ?EVALUATION COMPLEXITY: High ?  ?  ?GOALS: ?Goals reviewed with patient?  yes ?  ?SHORT TERM GOALS: ?  ?Patient will be independent in self management strategies to improve quality of life and functional outcomes. ?Baseline: new program ?Target date: 08/13/2021 ?Goal status: INITIAL ?  ?2.  Patient will report at least 50% improvement in overall symptoms and/or function to demonstrate improved functional mobility ?Baseline: 0% ?Target date: 08/13/2021 ?Goal status: INITIAL ?  ?3.  Patient will be able to reach across body and put on sunscreen without pain ?Baseline: unable ?Target date: 08/13/2021 ?Goal status: INITIAL ?  ?  ?  ?  ?  ?  ?LONG TERM GOALS: ?  ?Patient will report at least 75% improvement in overall symptoms and/or function to demonstrate improved functional mobility ?Baseline: 0% ?Target date: 09/03/2021 ?Goal status: INITIAL ?  ?2.  Patient will be able to demonstrate painfree ROM in right shoulder ?Baseline: unable ?Target date: 09/03/2021 ?Goal status: INITIAL ?  ?3.  Patient will be able to tolerate lifting weights and dance routine without pain to return to PLOF ?Baseline: unable ?Target date: 09/03/2021 ?Goal status: INITIAL ?  ?  ?  ?  ?  ?PLAN: ?PT FREQUENCY: 2x/week ?  ?PT DURATION: 6 weeks ?  ?PLANNED  INTERVENTIONS: Therapeutic exercises, Therapeutic activity, Neuromuscular re-education, Balance training, Gait training, Patient/Family education, Joint manipulation, Joint mobilization, Dry Needling, Cognit

## 2021-08-06 ENCOUNTER — Ambulatory Visit (INDEPENDENT_AMBULATORY_CARE_PROVIDER_SITE_OTHER): Payer: 59 | Admitting: Physical Therapy

## 2021-08-06 ENCOUNTER — Encounter: Payer: Self-pay | Admitting: Physical Therapy

## 2021-08-06 DIAGNOSIS — M25511 Pain in right shoulder: Secondary | ICD-10-CM

## 2021-08-06 DIAGNOSIS — M25611 Stiffness of right shoulder, not elsewhere classified: Secondary | ICD-10-CM | POA: Diagnosis not present

## 2021-08-06 NOTE — Therapy (Addendum)
OUTPATIENT PHYSICAL THERAPY TREATMENT NOTE   Patient Name: Cheryl Christian MRN: [id number] DOB:07-23-1980, 41 y.o., female Today's Date: 08/06/2021  PCP: Vivi Barrack, MD REFERRING PROVIDER: Vivi Barrack, MD  END OF SESSION:   PT End of Session - 08/06/21 0931     Visit Number 5    Number of Visits 12    Date for PT Re-Evaluation 09/03/21    Authorization Type UHC 3 left out of 22    Authorization - Visit Number 5    Authorization - Number of Visits 60    Progress Note Due on Visit 10    PT Start Time 0932    PT Stop Time 0955    PT Time Calculation (min) 23 min    Activity Tolerance Patient tolerated treatment well    Behavior During Therapy [facility] for tasks assessed/performed               Past Medical History:  Diagnosis Date   Essential hypertension 06/22/2018   Hypothyroid 05/20/2018   Major depression in complete remission ([facility]) 05/20/2018   Preeclampsia    Past Surgical History:  Procedure Laterality Date   EXCISION MASS ABDOMINAL Right 04/12/2019   Procedure: EXCISION ABDOMINAL WALL MASS;  Surgeon: Rolm Bookbinder, MD;  Location: [facility];  Service: General;  Laterality: Right;  GENERAL AND TAP BLOCK ANESTHESIA   WISDOM TOOTH EXTRACTION     41 yo   Patient Active Problem List   Diagnosis Date Noted   Irregular bleeding 11/29/2020   Postcoital bleeding 11/29/2020   Other endometriosis 10/04/2020   Anxiety 10/04/2020   Skin lesion 10/04/2020   Verruca 01/27/2020   Essential hypertension 06/22/2018   Hypothyroid 05/20/2018      PCP: Vivi Barrack, MD   REFERRING PROVIDER: Vivi Barrack, MD   REFERRING DIAG: M25.511 (ICD-10-CM) - Acute pain of right shoulder   THERAPY DIAG:  Acute pain of right shoulder     ONSET DATE: 07/05/21 DOI   SUBJECTIVE:                                                                                                                                                                                        SUBJECTIVE STATEMENT: 08/06/2021 No pain and hasn't taken pain med in a couple of days. States that she hasn't tried any overhead throwing, Reaching behind is still difficult putting on a cardigan but it doesn's hurt if she goes slow. States she feels 90% better.   Eval: States that she sleeps on her side and that bothers her, States certain options bothers her. States that she cant put sunscreen on her  other arm. States she likes to do dance workouts but she can't use her arms like she would. States that she also has a weight training ap and would like to return to PLOF.     Subjective taken from MD visit Patient here with pain in her right shoulder. She was on vacation in [place] and she slid down carpeted stairs about 2 weeks ago. She reached behind herself to catch herself and immediately noticed pain. She tried using ice with some improvement.  Pain has improved the last several days though still has significant mount of pain with certain motions.  Any sort of overhead activity makes the pain worse.  Hurts to throw.  Hurts to reach across her body.  She has been using ibuprofen with some improvement.  She has noticed a clunking sensation to her 3 times.  No swelling. Has started meloxicam     PERTINENT HISTORY: Abdomen surgery 2020, HTN   PAIN:  Are you having pain? Yes: NPRS scale: current 1, Pain location: right posterior shoulder pain Pain description: sharp Aggravating factors: movement, side lying Relieving factors: rest   PRECAUTIONS: None   WEIGHT BEARING RESTRICTIONS No   FALLS:  Has patient fallen in last 6 months? Yes. 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    PLOF: Independent   PATIENT GOALS regain ROM and return to PLOF    OBJECTIVE:    DIAGNOSTIC FINDINGS:  None at this time   COGNITION:           Overall  cognitive status: Within functional limits for tasks assessed                                  SENSATION: WFL   POSTURE: Rounded shoulders, slumped seated posture              UE Measurements       Upper Extremity Right 08/06/2021 Left 08/06/2021    A/PROM MMT A/PROM MMT  Shoulder Flexion WFL 4+ WFL 4+  Shoulder Extension          Shoulder Abduction WFL 5 WFL    Shoulder Adduction          Shoulder Internal Rotation WFL 5 WFL 4+  Shoulder External Rotation WFL 4+ WFL 4+  Elbow Flexion   5   5  Elbow Extension   5   5  Wrist Flexion          Wrist Extension          Wrist Supination          Wrist Pronation          Wrist Ulnar Deviation          Wrist Radial Deviation          Grip Strength NA  NA                         (Blank rows = not tested)                       * pain on posterior sholuder     SHOULDER SPECIAL TESTS: Negative lift off bilaterally, + apprehension test on right   JOINT MOBILITY TESTING:  Hypomobility noted in right shoulder  PALPATION:  Increased resting tone along posterior shoulder right but no pain.             TODAY'S TREATMENT:  08/06/2021 Therapeutic Exercise:            Aerobic: Supine:   Prone:             Seated: review of  lifting exercises, reaching behind slowly x25 R, review of HEP      PATIENT EDUCATION:  Education details: reviewed HEP, anticipated progress, plan moving forward. How to return to throwing and lifting Person educated: Patient Education method: Explanation, Demonstration, and Handouts Education comprehension: verbalized understanding       HOME EXERCISE PROGRAM: [id number]   ASSESSMENT:   CLINICAL IMPRESSION: 08/06/2021 All short term goals met and 2/3 long term goals met. Answered all questions and reviewed painful motions and turning those into exercises with focus on form and muscle activation. No pain noted with motions when they were slowed down. Patient to be placed on hold secondary to progress made.  Advised patient to continue current POC for 4 weeks prior to adding in throwing. Patient may return with desire to transition back to throwing otherwise will discharge after 4 weeks if patient confident in current POC and return to prior level of function.    Eval:Patient is a 41 y.o. female who was seen today for physical therapy evaluation and treatment for right shoulder pain after a fall with hand outstretched behind her. Patient presents with anterior instability and reduced ability to use arm throughout the day. Patient would greatly benefit from skilled PT to improve overall function and QOL..      OBJECTIVE IMPAIRMENTS decreased activity tolerance, decreased mobility, decreased ROM, decreased strength, postural dysfunction, and pain.    ACTIVITY LIMITATIONS  working out, putting sunscreen on .    PERSONAL FACTORS Fitness are also affecting patient's functional outcome.      REHAB POTENTIAL: Excellent   CLINICAL DECISION MAKING: Stable/uncomplicated   EVALUATION COMPLEXITY: High     GOALS: Goals reviewed with patient?  yes   SHORT TERM GOALS:   Patient will be independent in self management strategies to improve quality of life and functional outcomes. Baseline: new program Target date: 08/13/2021 Goal status: MET   2.  Patient will report at least 50% improvement in overall symptoms and/or function to demonstrate improved functional mobility Baseline: 0% Target date: 08/13/2021 Goal status: MET    3.  Patient will be able to reach across body and put on sunscreen without pain Baseline: unable Target date: 08/13/2021 Goal status: MET             LONG TERM GOALS:   Patient will report at least 75% improvement in overall symptoms and/or function to demonstrate improved functional mobility Baseline: 0% Target date: 09/03/2021 Goal status: MET   2.  Patient will be able to demonstrate painfree ROM in right shoulder Baseline: unable Target date: 09/03/2021 Goal  status: MET   3.  Patient will be able to tolerate lifting weights and dance routine without pain to return to PLOF Baseline: unable Target date: 09/03/2021 Goal status: PARTIALLY MET HAS Danced but hasn't add in weights yet            PLAN: PT FREQUENCY: 2x/week   PT DURATION: 6 weeks   PLANNED INTERVENTIONS: Therapeutic exercises, Therapeutic activity, Neuromuscular re-education, Balance training, Gait training, Patient/Family education, Joint manipulation, Joint mobilization, Dry Needling, Cognitive remediation, Electrical stimulation, Spinal manipulation, Spinal mobilization, Cryotherapy, Moist heat, Ultrasound,  Ionotophoresis 67m/ml Dexamethasone, and Manual therapy   PLAN FOR NEXT SESSION: on hold for 4 weeks   10:00 AM, 08/06/21 MJerene Pitch DPT Physical Therapy with [facility]   PHYSICAL THERAPY DISCHARGE SUMMARY  Visits from Start of Care: 5 Plan: Patient agrees to discharge.  Patient goals were met. Patient is being discharged due to meeting the stated rehab goals.      LLyndee Hensen PT, DPT 1:32 PM  12/30/21

## 2021-08-08 ENCOUNTER — Encounter: Payer: 59 | Admitting: Physical Therapy

## 2021-08-09 ENCOUNTER — Other Ambulatory Visit: Payer: Self-pay | Admitting: Family Medicine

## 2021-08-13 ENCOUNTER — Encounter: Payer: 59 | Admitting: Physical Therapy

## 2021-08-15 ENCOUNTER — Encounter: Payer: 59 | Admitting: Physical Therapy

## 2021-08-15 ENCOUNTER — Other Ambulatory Visit: Payer: Self-pay | Admitting: Family Medicine

## 2021-08-20 ENCOUNTER — Encounter: Payer: 59 | Admitting: Physical Therapy

## 2021-08-22 ENCOUNTER — Encounter: Payer: 59 | Admitting: Physical Therapy

## 2021-08-27 ENCOUNTER — Encounter: Payer: 59 | Admitting: Physical Therapy

## 2021-09-04 ENCOUNTER — Other Ambulatory Visit: Payer: Self-pay | Admitting: Family Medicine

## 2021-09-30 ENCOUNTER — Other Ambulatory Visit: Payer: Self-pay | Admitting: Family Medicine

## 2021-10-22 ENCOUNTER — Other Ambulatory Visit (HOSPITAL_BASED_OUTPATIENT_CLINIC_OR_DEPARTMENT_OTHER): Payer: Self-pay | Admitting: Obstetrics & Gynecology

## 2021-10-22 DIAGNOSIS — N930 Postcoital and contact bleeding: Secondary | ICD-10-CM

## 2021-10-22 DIAGNOSIS — N926 Irregular menstruation, unspecified: Secondary | ICD-10-CM

## 2021-10-25 ENCOUNTER — Other Ambulatory Visit: Payer: Self-pay | Admitting: Family Medicine

## 2021-11-04 ENCOUNTER — Other Ambulatory Visit (HOSPITAL_BASED_OUTPATIENT_CLINIC_OR_DEPARTMENT_OTHER): Payer: Self-pay | Admitting: Family Medicine

## 2021-11-04 DIAGNOSIS — Z1231 Encounter for screening mammogram for malignant neoplasm of breast: Secondary | ICD-10-CM

## 2021-11-25 ENCOUNTER — Other Ambulatory Visit: Payer: Self-pay | Admitting: Family Medicine

## 2021-11-25 DIAGNOSIS — I10 Essential (primary) hypertension: Secondary | ICD-10-CM

## 2021-12-06 ENCOUNTER — Ambulatory Visit (HOSPITAL_BASED_OUTPATIENT_CLINIC_OR_DEPARTMENT_OTHER)
Admission: RE | Admit: 2021-12-06 | Discharge: 2021-12-06 | Disposition: A | Discharge status: Home / Self Care | Payer: 59 | Source: Ambulatory Visit | Attending: Family Medicine | Admitting: Family Medicine

## 2021-12-06 DIAGNOSIS — Z1231 Encounter for screening mammogram for malignant neoplasm of breast: Secondary | ICD-10-CM | POA: Insufficient documentation

## 2021-12-09 ENCOUNTER — Encounter (HOSPITAL_BASED_OUTPATIENT_CLINIC_OR_DEPARTMENT_OTHER): Payer: Self-pay | Admitting: Obstetrics & Gynecology

## 2021-12-09 ENCOUNTER — Other Ambulatory Visit (HOSPITAL_COMMUNITY)
Admission: RE | Admit: 2021-12-09 | Discharge: 2021-12-09 | Disposition: A | Discharge status: Home / Self Care | Payer: 59 | Source: Ambulatory Visit | Attending: Obstetrics & Gynecology | Admitting: Obstetrics & Gynecology

## 2021-12-09 ENCOUNTER — Ambulatory Visit (INDEPENDENT_AMBULATORY_CARE_PROVIDER_SITE_OTHER): Payer: 59 | Admitting: Obstetrics & Gynecology

## 2021-12-09 VITALS — BP 142/93 | HR 76 | Ht 66.0 in | Wt 131.8 lb

## 2021-12-09 DIAGNOSIS — R922 Inconclusive mammogram: Secondary | ICD-10-CM | POA: Diagnosis not present

## 2021-12-09 DIAGNOSIS — N926 Irregular menstruation, unspecified: Secondary | ICD-10-CM

## 2021-12-09 DIAGNOSIS — Z01419 Encounter for gynecological examination (general) (routine) without abnormal findings: Secondary | ICD-10-CM

## 2021-12-09 DIAGNOSIS — N808 Other endometriosis: Secondary | ICD-10-CM

## 2021-12-09 DIAGNOSIS — Z124 Encounter for screening for malignant neoplasm of cervix: Secondary | ICD-10-CM | POA: Diagnosis present

## 2021-12-09 DIAGNOSIS — I10 Essential (primary) hypertension: Secondary | ICD-10-CM

## 2021-12-09 NOTE — Progress Notes (Signed)
41 y.o. [id number] Married White or Caucasian female here for annual exam.  Cycles are a little irregular over the summer.  Is on the POP.  Does skip her cycle some months.  Post coital bleeding has stopped.  Options discussed.  Interested in IUD at this point.  Husband had vasectomy.  Patient's last menstrual period was 11/29/2021 (exact date).          Sexually active: Yes.    The current method of family planning is vasectomy.    Exercising: Yes.     Smoker:  no  Health Maintenance: Pap:  11/26/2020 Negative, 05/2018 was last HR HPV testing History of abnormal Pap:  no MMG:  12/05/2020 Negative Colonoscopy:  guidelines reviewed Screening Labs: does with Dr. Jimmey Ralph   reports that she has never smoked. She has never used smokeless tobacco. She reports current alcohol use of about 12.0 - 14.0 standard drinks of alcohol per week. She reports that she does not use drugs.  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     levothyroxine (SYNTHROID) 25 MCG tablet Take 1 tablet (25 mcg total) by mouth daily. 90 tablet 3   losartan-hydrochlorothiazide (HYZAAR) 100-25 MG tablet TAKE 1 TABLET BY MOUTH EVERY DAY 30 tablet 5   norethindrone (MICRONOR) 0.35 MG tablet TAKE 1 TABLET BY MOUTH EVERY DAY 84 tablet 0    sertraline (ZOLOFT) 50 MG tablet TAKE 1 TABLET BY MOUTH EVERY DAY 90 tablet 0   No current facility-administered medications for this visit.    Family History  Problem Relation Age of Onset   Hypertension Mother    Lung disease Mother        lymphangioleimyomatosis (LAMB), s/p transplant   Hypertension Father    Thyroid disease Sister    Diabetes Paternal Grandfather     ROS: Constitutional: negative Genitourinary:negative  Exam:   BP (!) 142/93 (BP Location: Right Arm, Patient Position: Sitting, Cuff Size: Normal)   Pulse 76   Ht 5\' 6"  (1.676 m) Comment: Reported  Wt 131 lb 12.8 oz (59.8 kg)   LMP 11/29/2021 (Exact Date)   BMI 21.27 kg/m   Height: 5\' 6"  (167.6 cm) (Reported)  General appearance: alert, cooperative and appears stated age Head: Normocephalic, without obvious abnormality, atraumatic Neck: no adenopathy, supple, symmetrical, trachea midline and thyroid normal to inspection and palpation Lungs: clear to auscultation bilaterally Breasts: normal appearance, no masses or tenderness Heart: regular rate and rhythm Abdomen: soft, non-tender; bowel sounds normal; no masses,  no organomegaly Extremities: extremities normal, atraumatic, no cyanosis or edema Skin: Skin color, texture, turgor normal. No rashes or lesions Lymph nodes: Cervical, supraclavicular, and axillary nodes normal. No abnormal inguinal nodes palpated Neurologic: Grossly normal   Pelvic: External genitalia:  no lesions  Urethra:  normal appearing urethra with no masses, tenderness or lesions              Bartholins and Skenes: normal                 Vagina: normal appearing vagina with normal color and no discharge, no lesions              Cervix: no lesions              Pap taken: Yes.   Bimanual Exam:  Uterus:  normal size, contour, position, consistency, mobility, non-tender              Adnexa: normal adnexa and no mass, fullness, tenderness               Rectovaginal:  Confirms               Anus:  normal sphincter tone, no lesions  Chaperone, Ina Homes, CMA, was present for exam.  Assessment/Plan: 1. Well woman exam with routine gynecological exam - Pap smear obtained today with HR HPV - Mammogram done last friday - Colonoscopy guidelines reviewed - lab work done done with PCP - vaccines reviewed/updated  2. Irregular bleeding - Follicle stimulating hormone - Estradiol - considering mirena IUD  3. Cervical cancer screening - Cytology - PAP( [place])  4. Breast density - findings on MMG discussed.  Screening breast MRI discussed  5. Essential hypertension - on medication and pt does monitor this at home with normal values  6. Other endometriosis

## 2021-12-10 LAB — FOLLICLE STIMULATING HORMONE: FSH: 5.5 m[IU]/mL

## 2021-12-10 LAB — ESTRADIOL: Estradiol: 223 pg/mL

## 2021-12-12 LAB — CYTOLOGY - PAP
Comment: NEGATIVE
Diagnosis: NEGATIVE
High risk HPV: NEGATIVE

## 2021-12-19 ENCOUNTER — Encounter (HOSPITAL_BASED_OUTPATIENT_CLINIC_OR_DEPARTMENT_OTHER): Payer: Self-pay | Admitting: Obstetrics & Gynecology

## 2021-12-20 ENCOUNTER — Other Ambulatory Visit (HOSPITAL_BASED_OUTPATIENT_CLINIC_OR_DEPARTMENT_OTHER): Payer: Self-pay | Admitting: Obstetrics & Gynecology

## 2021-12-20 MED ORDER — ESTRADIOL 0.1 MG/GM VA CREA: TOPICAL_CREAM | VAGINAL | 2 refills | Status: DC

## 2021-12-23 ENCOUNTER — Encounter: Payer: Self-pay | Admitting: *Deleted

## 2021-12-25 ENCOUNTER — Other Ambulatory Visit: Payer: Self-pay | Admitting: Family Medicine

## 2022-01-18 ENCOUNTER — Other Ambulatory Visit (HOSPITAL_BASED_OUTPATIENT_CLINIC_OR_DEPARTMENT_OTHER): Payer: Self-pay | Admitting: Obstetrics & Gynecology

## 2022-01-18 DIAGNOSIS — N926 Irregular menstruation, unspecified: Secondary | ICD-10-CM

## 2022-01-18 DIAGNOSIS — N930 Postcoital and contact bleeding: Secondary | ICD-10-CM

## 2022-01-26 ENCOUNTER — Other Ambulatory Visit: Payer: Self-pay | Admitting: Family Medicine

## 2022-02-03 ENCOUNTER — Encounter (HOSPITAL_BASED_OUTPATIENT_CLINIC_OR_DEPARTMENT_OTHER): Payer: Self-pay | Admitting: Obstetrics & Gynecology

## 2022-02-21 ENCOUNTER — Other Ambulatory Visit: Payer: Self-pay | Admitting: Family Medicine

## 2022-04-15 ENCOUNTER — Other Ambulatory Visit (HOSPITAL_BASED_OUTPATIENT_CLINIC_OR_DEPARTMENT_OTHER): Payer: Self-pay | Admitting: Obstetrics & Gynecology

## 2022-04-15 DIAGNOSIS — N926 Irregular menstruation, unspecified: Secondary | ICD-10-CM

## 2022-04-15 DIAGNOSIS — N930 Postcoital and contact bleeding: Secondary | ICD-10-CM

## 2022-05-07 ENCOUNTER — Other Ambulatory Visit: Payer: Self-pay | Admitting: Family Medicine

## 2022-05-07 DIAGNOSIS — I10 Essential (primary) hypertension: Secondary | ICD-10-CM

## 2022-05-11 ENCOUNTER — Other Ambulatory Visit (HOSPITAL_BASED_OUTPATIENT_CLINIC_OR_DEPARTMENT_OTHER): Payer: Self-pay | Admitting: Obstetrics & Gynecology

## 2022-05-13 ENCOUNTER — Ambulatory Visit (INDEPENDENT_AMBULATORY_CARE_PROVIDER_SITE_OTHER): Payer: 59 | Admitting: Family Medicine

## 2022-05-13 ENCOUNTER — Encounter: Payer: Self-pay | Admitting: Family Medicine

## 2022-05-13 VITALS — BP 159/95 | HR 80 | Temp 97.5°F | Ht 66.0 in | Wt 126.2 lb

## 2022-05-13 DIAGNOSIS — I10 Essential (primary) hypertension: Secondary | ICD-10-CM | POA: Diagnosis not present

## 2022-05-13 DIAGNOSIS — Z13220 Encounter for screening for lipoid disorders: Secondary | ICD-10-CM | POA: Diagnosis not present

## 2022-05-13 DIAGNOSIS — E039 Hypothyroidism, unspecified: Secondary | ICD-10-CM | POA: Diagnosis not present

## 2022-05-13 DIAGNOSIS — F419 Anxiety disorder, unspecified: Secondary | ICD-10-CM | POA: Diagnosis not present

## 2022-05-13 LAB — COMPREHENSIVE METABOLIC PANEL
ALT: 18 U/L (ref 0–35)
AST: 20 U/L (ref 0–37)
Albumin: 4.6 g/dL (ref 3.5–5.2)
Alkaline Phosphatase: 34 U/L — ABNORMAL LOW (ref 39–117)
BUN: 10 mg/dL (ref 6–23)
CO2: 30 mEq/L (ref 19–32)
Calcium: 9.7 mg/dL (ref 8.4–10.5)
Chloride: 98 mEq/L (ref 96–112)
Creatinine, Ser: 0.67 mg/dL (ref 0.40–1.20)
GFR: 106.91 mL/min (ref >60.00)
Glucose, Bld: 100 mg/dL — ABNORMAL HIGH (ref 70–99)
Potassium: 4 mEq/L (ref 3.5–5.1)
Sodium: 136 mEq/L (ref 135–145)
Total Bilirubin: 0.5 mg/dL (ref 0.2–1.2)
Total Protein: 7.1 g/dL (ref 6.0–8.3)

## 2022-05-13 LAB — CBC
HCT: 38.5 % (ref 36.0–46.0)
Hemoglobin: 13.2 g/dL (ref 12.0–15.0)
MCHC: 34.3 g/dL (ref 30.0–36.0)
MCV: 87.6 fl (ref 78.0–100.0)
Platelets: 303 10*3/uL (ref 150.0–400.0)
RBC: 4.39 Mil/uL (ref 3.87–5.11)
RDW: 12.7 % (ref 11.5–15.5)
WBC: 5.3 10*3/uL (ref 4.0–10.5)

## 2022-05-13 LAB — LIPID PANEL
Cholesterol: 190 mg/dL (ref 0–200)
HDL: 80.7 mg/dL (ref >39.00)
LDL Cholesterol: 94 mg/dL (ref 0–99)
NonHDL: 109.07
Total CHOL/HDL Ratio: 2
Triglycerides: 77 mg/dL (ref 0.0–149.0)
VLDL: 15.4 mg/dL (ref 0.0–40.0)

## 2022-05-13 LAB — TSH: TSH: 2.79 u[IU]/mL (ref 0.35–5.50)

## 2022-05-13 NOTE — Assessment & Plan Note (Signed)
Check TSH.  Continue Synthroid 25 mcg daily. 

## 2022-05-13 NOTE — Assessment & Plan Note (Signed)
Above goal though she has typically been well-controlled.  She has been well-controlled at home and at other doctors visits as well.  Will continue her current dose of losartan-HCTZ 100-25 once daily.  Check labs.  She will continue to monitor at home.

## 2022-05-13 NOTE — Assessment & Plan Note (Signed)
Symptoms are well-controlled on current regimen of Zoloft 50 mg daily and BuSpar 10 mg twice daily.  Does not need refill today.

## 2022-05-13 NOTE — Patient Instructions (Signed)
It was very nice to see you today!  Please call the dermatologist about the cyst on your scalp.  You probably have a small ventral hernia.  Please let me know if you develop any other symptoms.   We will check blood work today.  Please keep an eye on your blood pressure and let us know if it is persistently elevated.  I will see you back in year for your next physical.  Come back sooner if needed.  Take care, Dr Jerline Pain  PLEASE NOTE:  If you had any lab tests, please let us know if you have not heard back within a few days. You may see your results on mychart before we have a chance to review them but we will give you a call once they are reviewed by [place].   If we ordered any referrals today, please let us know if you have not heard from their office within the next week.   If you had any urgent prescriptions sent in today, please check with the pharmacy within an hour of our visit to make sure the prescription was transmitted appropriately.   Please try these tips to maintain a healthy lifestyle:  Eat at least 3 REAL meals and 1-2 snacks per day.  Aim for no more than 5 hours between eating.  If you eat breakfast, please do so within one hour of getting up.   Each meal should contain half fruits/vegetables, one quarter protein, and one quarter carbs (no bigger than a computer mouse)  Cut down on sweet beverages. This includes juice, soda, and sweet tea.   Drink at least 1 glass of water with each meal and aim for at least 8 glasses per day  Exercise at least 150 minutes every week.    Ventral Hernia  A ventral hernia is a bulge of tissue from inside the abdomen that pushes through a weak area of the muscles that form the front wall of the abdomen. The tissues inside the abdomen are inside a sac (peritoneum). These tissues include the small intestine, large intestine, and the fatty tissue that covers the intestines (omentum). Sometimes, the bulge that forms a hernia contains  intestines. Other hernias contain only fat. Ventral hernias do not go away without surgical treatment. There are several types of ventral hernias. You may have: A hernia at an incision site from previous abdominal surgery (incisional hernia). A hernia just above the belly button (epigastric hernia), or at the belly button (umbilical hernia). These types of hernias can develop from heavy lifting or straining. A hernia that comes and goes (reducible hernia). It may be visible only when you lift or strain. This type of hernia can be pushed back into the abdomen (reduced). A hernia that traps abdominal tissue inside the hernia (incarcerated hernia). This type of hernia does not reduce. A hernia that cuts off blood flow to the tissues inside the hernia (strangulated hernia). The tissues can start to die if this happens. This is a very painful bulge that cannot be reduced. A strangulated hernia is a medical emergency. What are the causes? This condition is caused by abdominal tissue putting pressure on an area of weakness in the abdominal muscles. What increases the risk? The following factors may make you more likely to develop this condition: Being age 59 or older. Being overweight or obese. Having had previous abdominal surgery, especially if there was an infection after surgery. Having had an injury to the abdominal wall. Frequently lifting or pushing heavy  objects. Having had several pregnancies. Having a buildup of fluid inside the abdomen (ascites). Straining to have a bowel movement or to urinate. Having frequent coughing episodes. What are the signs or symptoms? The only symptom of a ventral hernia may be a painless bulge in the abdomen. A reducible hernia may be visible only when you strain, cough, or lift. Other symptoms may include: Dull pain. A feeling of pressure. Signs and symptoms of a strangulated hernia may include: Increasing pain. Nausea and vomiting. Pain when pressing on  the hernia. The skin over the hernia turning red or purple. Constipation. Blood in the stool (feces). How is this diagnosed? This condition may be diagnosed based on: Your symptoms. Your medical history. A physical exam. You may be asked to cough or strain while standing. These actions increase the pressure inside your abdomen and force the hernia through the opening in your muscles. Your health care provider may try to reduce the hernia by gently pushing the hernia back in. Imaging studies, such as an ultrasound or CT scan. How is this treated? This condition is treated with surgery. If you have a strangulated hernia, surgery is done as soon as possible. If your hernia is small and not incarcerated, you may be asked to lose some weight before surgery. Follow these instructions at home: Follow instructions from your health care provider about eating or drinking restrictions. If you are overweight, your health care provider may recommend that you increase your activity level and eat a healthier diet. Do not lift anything that is heavier than 10 lb (4.5 kg), or the limit that you are told, until your health care provider says that it is safe. Return to your normal activities as told by your health care provider. Ask your health care provider what activities are safe for you. You may need to avoid activities that increase pressure on your hernia. Take over-the-counter and prescription medicines only as told by your health care provider. Keep all follow-up visits. This is important. Contact a health care provider if: Your hernia gets larger. Your hernia becomes painful. Get help right away if: Your hernia becomes increasingly painful. You have pain along with any of the following: Changes in skin color in the area of the hernia. Nausea. Vomiting. Fever. These symptoms may represent a serious problem that is an emergency. Do not wait to see if the symptoms will go away. Get medical help right  away. Call your local emergency services (911 in the [place] Do not drive yourself to the hospital. Summary A ventral hernia is a bulge of tissue from inside the abdomen that pushes through a weak area of the muscles that form the front wall of the abdomen. This condition is treated with surgery, which may be urgent depending on your hernia. Do not lift anything that is heavier than 10 lb (4.5 kg), and follow activity instructions from your health care provider. This information is not intended to replace advice given to you by your health care provider. Make sure you discuss any questions you have with your health care provider. Document Revised: 11/04/2019 Document Reviewed: 11/04/2019 Elsevier Patient Education  [place].

## 2022-05-13 NOTE — Progress Notes (Signed)
   Cheryl Christian is a 42 y.o. female who presents today for an office visit.  Assessment/Plan:  New/Acute Problems: Ventral Hernia No red flag signs or symptoms.  We discussed typical course and that this will likely worsen over time.  We did discuss referral to surgery for repair however given that she is currently asymptomatic and not interested in pursuing surgery at this time we will defer for now.  We did discuss reasons to return to care.  We discussed reasons to seek emergent care and warning signs for incarcerated hernia.  She will let me know if symptoms worsen or if new symptoms arise and we can refer to surgery at that point.  Cyst Consistent with epidermal inclusion cyst.  Advised her to follow-up with her dermatologist for further management.  Chronic Problems Addressed Today: Essential hypertension Above goal though she has typically been well-controlled.  She has been well-controlled at home and at other doctors visits as well.  Will continue her current dose of losartan-HCTZ 100-25 once daily.  Check labs.  She will continue to monitor at home.  Hypothyroid Check TSH.  Continue Synthroid 25 mcg daily.  Anxiety Symptoms are well-controlled on current regimen of Zoloft 50 mg daily and BuSpar 10 mg twice daily.  Does not need refill today.     Subjective:  HPI:  See A/P for status of chronic conditions.  Patient has a few issues that she would like to discuss today.  She has noticed a cyst on her her left forehead.  No pain.  No treatments tried.  She has also noticed a bulge in her abdominal wall.  Started over a year ago.  Worse with coughing and straining.  No pain.  She did have endometriosis in the past and laparoscopic surgery for this and she has noticed that the bulge seems to be at the place of her surgical incision.  No reported nausea or vomiting.  No reported constipation or diarrhea.        Objective:  Physical Exam: BP (!) 159/95   Pulse 80    Temp (!) 97.5 F (36.4 C) (Temporal)   Ht 5\' 6"  (1.676 m)   Wt 126 lb 3.2 oz (57.2 kg)   LMP 05/03/2022   SpO2 100%   BMI 20.37 kg/m   Gen: No acute distress, resting comfortably CV: Regular rate and rhythm with no murmurs appreciated Pulm: Normal work of breathing, clear to auscultation bilaterally with no crackles, wheezes, or rhonchi GI: Bowel sounds present.  Surgical scars noted.  Scar tissue palpated near site of surgical incision just superior to umbilicus palpable bulge noted with Valsalva.  Easily reduced. Skin: Approximately 5 mm freely mobile cystic lesion on left anterior forehead. Neuro: Grossly normal, moves all extremities Psych: Normal affect and thought content      Jamyrah Saur M. Jerline Pain, MD 05/13/2022 10:55 AM

## 2022-05-16 NOTE — Progress Notes (Signed)
Please inform patient of the following:  Great news!  All of her labs are at goal.  Do not need to make any changes to her treatment plan at this time.  She should continue to work on diet and exercise and we can recheck in a year or so.

## 2022-05-18 ENCOUNTER — Encounter: Payer: Self-pay | Admitting: Family Medicine

## 2022-05-19 ENCOUNTER — Encounter: Payer: Self-pay | Admitting: *Deleted

## 2022-05-19 NOTE — Telephone Encounter (Signed)
Her diastolic numbers are a little higher than goal. She is already on max dose losartan-HCTZ.  We could add on a new medication but I think we can continue to monitor for another 1-2 weeks. Would like for her to continue to monitor and send [place] a message in early March if possible.  Algis Greenhouse. Jerline Pain, MD 05/19/2022 7:49 AM

## 2022-05-26 ENCOUNTER — Other Ambulatory Visit: Payer: Self-pay | Admitting: Family Medicine

## 2022-05-26 DIAGNOSIS — I10 Essential (primary) hypertension: Secondary | ICD-10-CM

## 2022-05-28 ENCOUNTER — Other Ambulatory Visit: Payer: Self-pay | Admitting: Family Medicine

## 2022-06-11 ENCOUNTER — Other Ambulatory Visit: Payer: Self-pay | Admitting: Family Medicine

## 2022-07-04 ENCOUNTER — Other Ambulatory Visit (HOSPITAL_BASED_OUTPATIENT_CLINIC_OR_DEPARTMENT_OTHER): Payer: Self-pay | Admitting: Obstetrics & Gynecology

## 2022-07-04 ENCOUNTER — Other Ambulatory Visit: Payer: Self-pay | Admitting: Family Medicine

## 2022-07-04 DIAGNOSIS — N926 Irregular menstruation, unspecified: Secondary | ICD-10-CM

## 2022-07-04 DIAGNOSIS — N930 Postcoital and contact bleeding: Secondary | ICD-10-CM

## 2022-10-28 ENCOUNTER — Other Ambulatory Visit (HOSPITAL_BASED_OUTPATIENT_CLINIC_OR_DEPARTMENT_OTHER): Payer: Self-pay | Admitting: Obstetrics & Gynecology

## 2022-10-31 ENCOUNTER — Other Ambulatory Visit: Payer: Self-pay | Admitting: Family Medicine

## 2022-10-31 DIAGNOSIS — I10 Essential (primary) hypertension: Secondary | ICD-10-CM

## 2022-11-24 ENCOUNTER — Other Ambulatory Visit (HOSPITAL_BASED_OUTPATIENT_CLINIC_OR_DEPARTMENT_OTHER): Payer: Self-pay | Admitting: Obstetrics & Gynecology

## 2022-11-24 ENCOUNTER — Other Ambulatory Visit: Payer: Self-pay | Admitting: Family Medicine

## 2022-12-02 ENCOUNTER — Other Ambulatory Visit: Payer: Self-pay | Admitting: Family Medicine

## 2022-12-05 IMAGING — MG MM DIGITAL SCREENING BILAT W/ TOMO AND CAD
6 of 10 series · 6 of 30 positions shown · non-contrast
Comparison: None.

CLINICAL DATA: Screening.

EXAM:
DIGITAL SCREENING BILATERAL MAMMOGRAM WITH TOMOSYNTHESIS AND CAD
TECHNIQUE: Bilateral screening digital craniocaudal and mediolateral oblique
mammograms were obtained. Bilateral screening digital breast
tomosynthesis was performed. The images were evaluated with
computer-aided detection.

[L CC synth-2D]
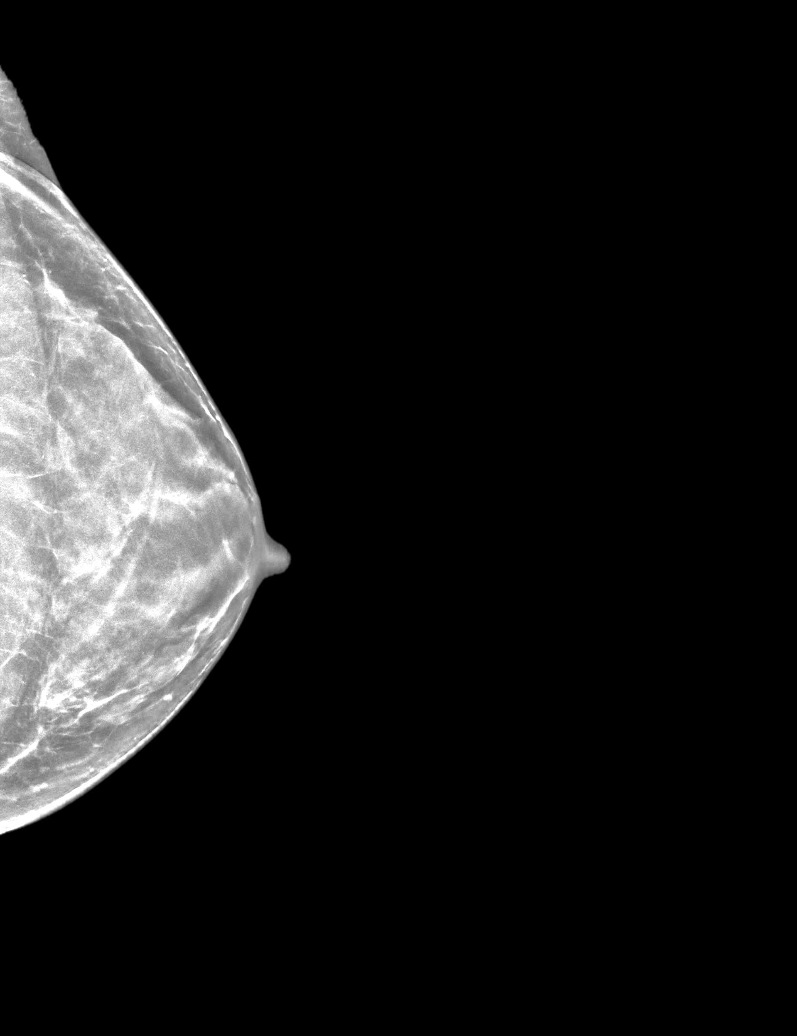

[L MLO synth-2D]
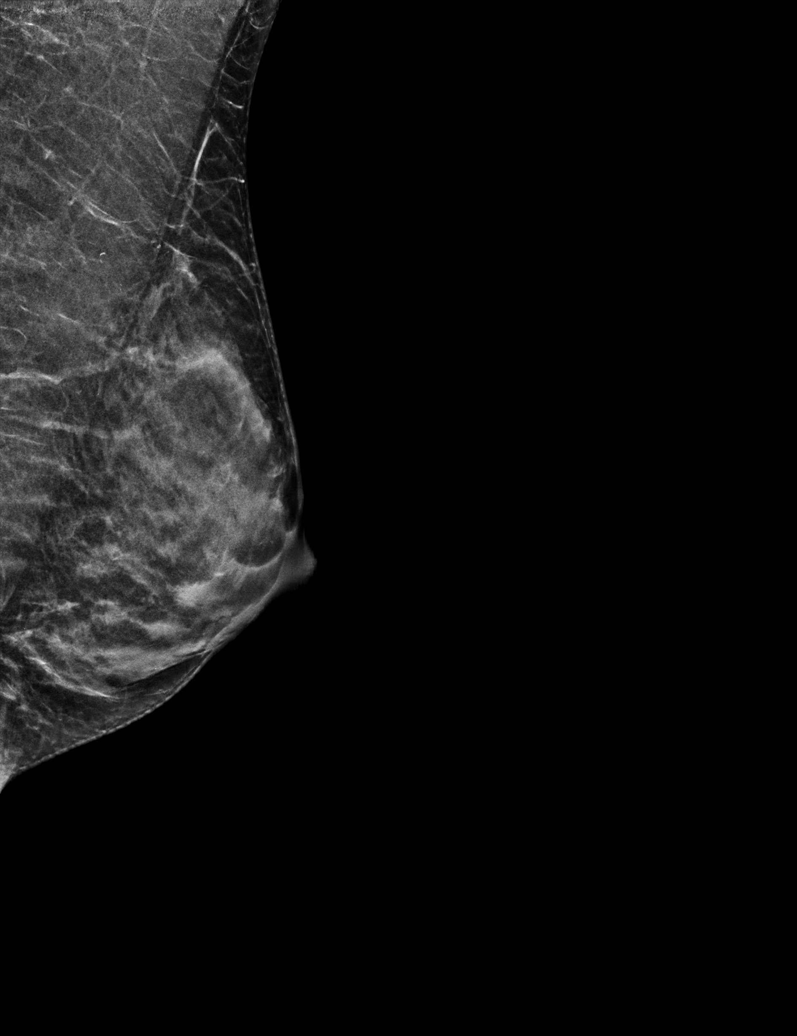

[R CC synth-2D (1 of 2)]
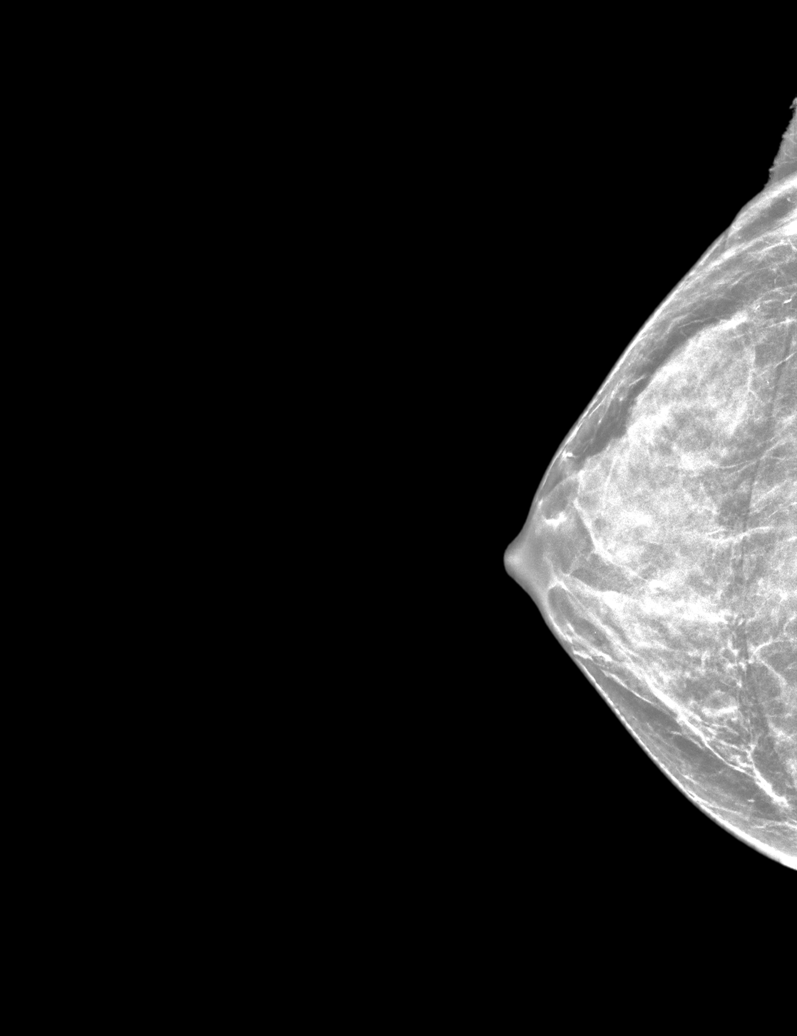

[R CC synth-2D (2 of 2)]
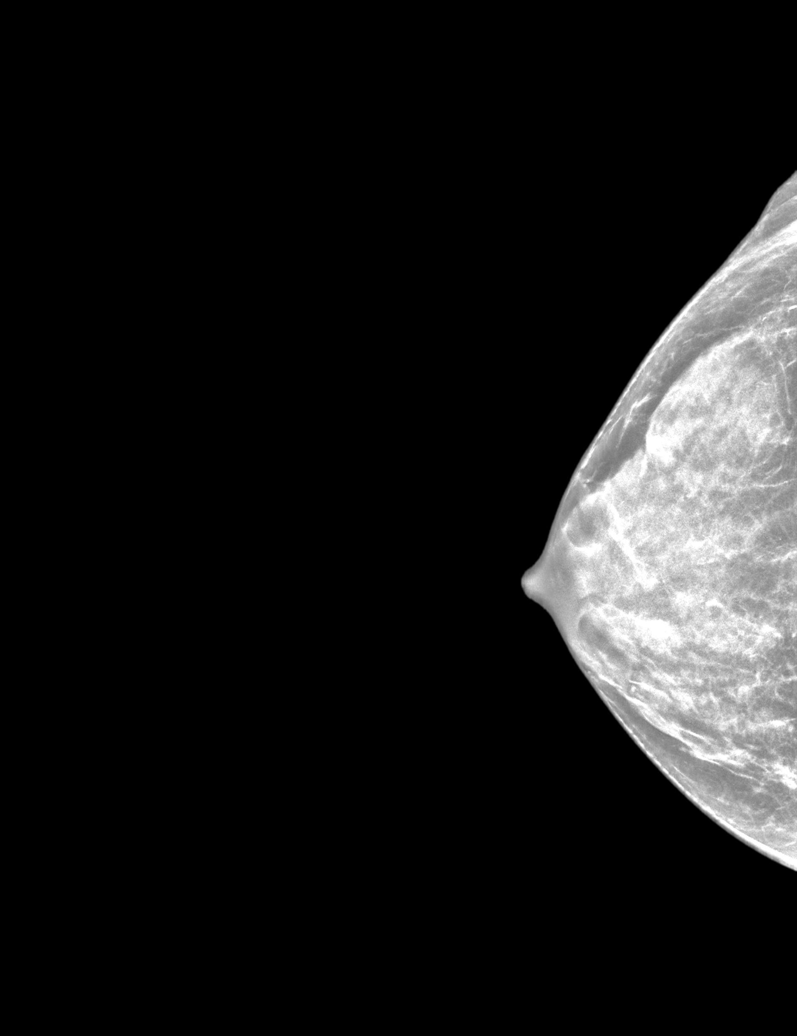

[R MLO synth-2D]
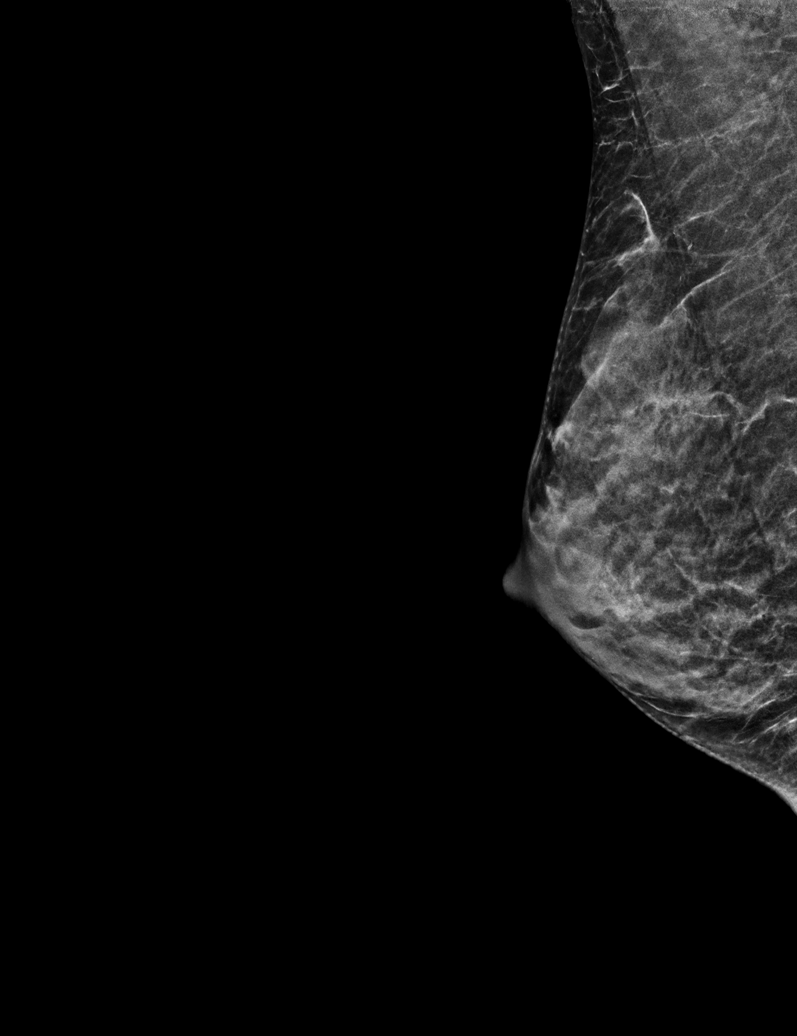

[R MLO tomo · tomo slice 21/40.0]
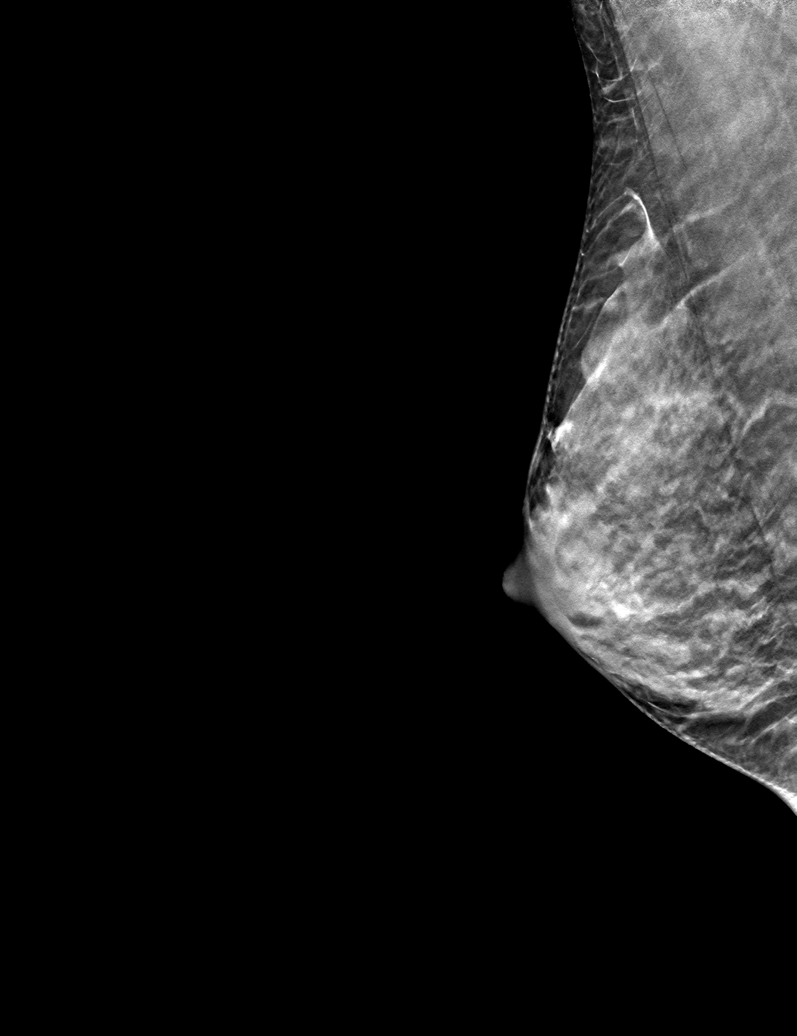

[6 of 30 positions shown; findings below may reference images not displayed]

ACR Breast Density Category d: The breast tissue is extremely dense,
which lowers the sensitivity of mammography.
FINDINGS: There are no findings suspicious for malignancy.
IMPRESSION: No mammographic evidence of malignancy. A result letter of this
screening mammogram will be mailed directly to the patient.

RECOMMENDATION:
Screening mammogram in one year. (Code:W3-Z-XIN)

BI-RADS CATEGORY  1: Negative.

## 2022-12-17 ENCOUNTER — Ambulatory Visit (HOSPITAL_BASED_OUTPATIENT_CLINIC_OR_DEPARTMENT_OTHER): Payer: 59 | Admitting: Obstetrics & Gynecology

## 2022-12-18 ENCOUNTER — Encounter (HOSPITAL_BASED_OUTPATIENT_CLINIC_OR_DEPARTMENT_OTHER): Payer: Self-pay | Admitting: Advanced Practice Midwife

## 2022-12-18 ENCOUNTER — Ambulatory Visit (HOSPITAL_BASED_OUTPATIENT_CLINIC_OR_DEPARTMENT_OTHER): Payer: 59 | Admitting: Advanced Practice Midwife

## 2022-12-18 VITALS — BP 135/94 | HR 78 | Ht 66.0 in | Wt 129.4 lb

## 2022-12-18 DIAGNOSIS — Z01419 Encounter for gynecological examination (general) (routine) without abnormal findings: Secondary | ICD-10-CM

## 2022-12-18 DIAGNOSIS — I10 Essential (primary) hypertension: Secondary | ICD-10-CM

## 2022-12-18 DIAGNOSIS — R92343 Mammographic extreme density, bilateral breasts: Secondary | ICD-10-CM

## 2022-12-18 DIAGNOSIS — Z1231 Encounter for screening mammogram for malignant neoplasm of breast: Secondary | ICD-10-CM

## 2022-12-18 DIAGNOSIS — N926 Irregular menstruation, unspecified: Secondary | ICD-10-CM

## 2022-12-18 MED ORDER — SLYND 4 MG PO TABS
ORAL_TABLET | ORAL | 11 refills | Status: DC
Start: 2022-12-18 — End: 2023-11-10

## 2022-12-18 NOTE — Progress Notes (Signed)
Subjective:     Cheryl Christian is a 42 y.o. female here at [facility] for a routine exam.  Current complaints: frequent menses, irregular, occurring at least every 2 weeks. She is taking Micronor daily, and this initially menses were improved (lighter and less frequent) but are now often.  She denies any s/sx of anemia and reports she donates blood periodically and her Hgb is normal before she donates. She is treated for HTN but BP is well controlled. Personal health history reviewed: yes.  Do you have a primary care provider? yes Do you feel safe at home? yes  Flowsheet Row Office Visit from 05/13/2022 in [facility] Total Score 0       Health Maintenance Due  Topic Date Due   INFLUENZA VACCINE  10/30/2022     Risk factors for chronic health problems: Smoking: Alchohol/how much: Pt BMI: Body mass index is 20.89 kg/m.   Gynecologic History Patient's last menstrual period was 12/15/2022 (exact date). Contraception: vasectomy Last Pap: 12/09/21. Results were: normal Last mammogram: 12/06/21. Results were: normal  Obstetric History OB History  Gravida Para Term Preterm AB Living  3 2 2   1 2   SAB IAB Ectopic Multiple Live Births  1       2    # Outcome Date GA Lbr Len/2nd Weight Sex Type Anes PTL Lv  3 Term 12/20/11    M Vag-Spont   LIV  2 Term 12/27/09    F Vag-Spont   LIV  1 SAB              The following portions of the patient's history were reviewed and updated as appropriate: allergies, current medications, past family history, past medical history, past social history, past surgical history, and problem list.  Review of Systems Pertinent items noted in HPI and remainder of comprehensive ROS otherwise negative.    Objective:  BP (!) 135/94 (BP Location: Left Arm, Patient Position: Sitting, Cuff Size: Normal)   Pulse 78   Ht 5\' 6"  (1.676 m)   Wt 129 lb 6.4 oz (58.7 kg)   LMP 12/15/2022 (Exact Date)   BMI 20.89 kg/m    VS reviewed, nursing note reviewed,  Constitutional: well developed, well nourished, no distress HEENT: normocephalic, thyroid without enlargement or mass HEART: RRR, no murmurs rubs/gallops RESP: clear and equal to auscultation bilaterally in all lobes  Breast Exam:  exam performed: right breast normal without abnormal mass, skin or nipple changes or axillary nodes, left breast normal without abnormal mass, skin or nipple changes or axillary nodes Abdomen: soft Neuro: alert and oriented x 3 Skin: warm, dry Psych: affect normal Pelvic exam:  Bimanual exam: Cervix 0/long/high, firm, anterior, neg CMT, uterus nontender, nonenlarged, adnexa without tenderness, enlargement, or mass        Assessment/Plan:   1. Encounter for annual routine gynecological examination   2. Irregular bleeding  --pt taking micronor, but bleeding every 2 weeks, not heavy, but frequent.  Taking POPs with HTN.  --Discussed options, and pt to try Slynd with placebo days to see if more regular menses. If not improved, may return to Micronor or discuss other options.  3. Essential hypertension --Managed by PCP   4. Extremely dense tissue of both breasts on mammography --Mammogram in 2023 (and previous imagine) reporting dense breasts, category d.  Discussed with pt today, who has previously discussed with Dr Hyacinth Meeker. Pt to consider MRI after mammogram this year.  5. Encounter for screening mammogram for malignant neoplasm of breast  - MM 3D SCREENING MAMMOGRAM BILATERAL BREAST; Future   Return in about 1 year (around 12/18/2023) for annual exam.   Sharen Counter, CNM 5:07 PM

## 2022-12-19 ENCOUNTER — Other Ambulatory Visit (HOSPITAL_BASED_OUTPATIENT_CLINIC_OR_DEPARTMENT_OTHER): Payer: Self-pay | Admitting: Obstetrics & Gynecology

## 2022-12-19 DIAGNOSIS — R92343 Mammographic extreme density, bilateral breasts: Secondary | ICD-10-CM | POA: Insufficient documentation

## 2022-12-19 DIAGNOSIS — N930 Postcoital and contact bleeding: Secondary | ICD-10-CM

## 2022-12-19 DIAGNOSIS — N926 Irregular menstruation, unspecified: Secondary | ICD-10-CM

## 2022-12-26 ENCOUNTER — Other Ambulatory Visit (HOSPITAL_BASED_OUTPATIENT_CLINIC_OR_DEPARTMENT_OTHER): Payer: Self-pay | Admitting: Obstetrics & Gynecology

## 2022-12-30 ENCOUNTER — Other Ambulatory Visit: Payer: Self-pay | Admitting: Family Medicine

## 2023-01-08 ENCOUNTER — Ambulatory Visit (HOSPITAL_BASED_OUTPATIENT_CLINIC_OR_DEPARTMENT_OTHER)
Admission: RE | Admit: 2023-01-08 | Discharge: 2023-01-08 | Disposition: A | Discharge status: Home / Self Care | Payer: 59 | Source: Ambulatory Visit | Attending: Advanced Practice Midwife | Admitting: Advanced Practice Midwife

## 2023-01-08 DIAGNOSIS — Z1231 Encounter for screening mammogram for malignant neoplasm of breast: Secondary | ICD-10-CM | POA: Diagnosis present

## 2023-01-15 ENCOUNTER — Encounter: Payer: Self-pay | Admitting: Physician Assistant

## 2023-01-15 ENCOUNTER — Ambulatory Visit: Payer: 59 | Admitting: Physician Assistant

## 2023-01-15 VITALS — BP 130/80 | HR 110 | Temp 103.2°F | Ht 66.0 in | Wt 129.0 lb

## 2023-01-15 DIAGNOSIS — J189 Pneumonia, unspecified organism: Secondary | ICD-10-CM | POA: Diagnosis not present

## 2023-01-15 DIAGNOSIS — R509 Fever, unspecified: Secondary | ICD-10-CM | POA: Diagnosis not present

## 2023-01-15 LAB — POC COVID19 BINAXNOW: SARS Coronavirus 2 Ag: NEGATIVE

## 2023-01-15 LAB — POCT INFLUENZA A/B
Influenza A, POC: NEGATIVE
Influenza B, POC: NEGATIVE

## 2023-01-15 MED ORDER — DOXYCYCLINE HYCLATE 100 MG PO TABS
100.0000 mg | ORAL_TABLET | Freq: Two times a day (BID) | ORAL | 0 refills | Status: AC
Start: 2023-01-15 — End: 2023-01-20

## 2023-01-15 NOTE — Progress Notes (Signed)
Subjective:    Patient ID: Cheryl Christian, female    DOB: Sep 30, 1980, 42 y.o.   MRN: [id number]  Chief Complaint  Patient presents with   Cough    Pt c/o fever of 103, cough and chills since "Sunday.    HPI Discussed the use of AI scribe software for clinical note transcription with the patient, who gave verbal consent to proceed.  History of Present Illness   The patient presents with a fever and cough that started about 5-6 days ago. They report feeling 'achy and foggy' and have been producing yellow phlegm. They have been taking Mucinex, guaifenesin, and ibuprofen to manage their symptoms. They have a son who recently had pneumonia and they are concerned they may have contracted it as well. They deny any allergies, asthma, or smoking history. They also mention a headache and a dry nose. No CP or SOB.       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   Preeclampsia     Past Surgical History:  Procedure Laterality Date   EXCISION MASS ABDOMINAL Right 04/12/2019   Procedure: EXCISION ABDOMINAL WALL MASS;  Surgeon: Wakefield, Matthew, MD;  Location: [facility];  Service: General;  Laterality: Right;  GENERAL AND TAP BLOCK ANESTHESIA   WISDOM TOOTH EXTRACTION     16"  yo    Family History  Problem Relation Age of Onset   Hypertension Mother    Lung disease Mother        lymphangioleimyomatosis (LAMB), s/p transplant   Hypertension Father    Thyroid disease Sister    Diabetes Paternal Grandfather     Social History   Tobacco Use   Smoking status: Never   Smokeless tobacco: Never  Vaping Use   Vaping status: Never Used  Substance Use Topics   Alcohol use: Yes    Alcohol/week: 12.0 - 14.0 standard drinks of alcohol    Types: 12 - 14 Standard drinks or equivalent per week   Drug use: Never     No Known Allergies  Review of  Systems NEGATIVE UNLESS OTHERWISE INDICATED IN HPI      Objective:     BP 130/80   Pulse (!) 110   Temp (!) 103.2 F (39.6 C)   Ht 5\' 6"  (1.676 m)   Wt 129 lb (58.5 kg)   LMP 12/15/2022 (Exact Date)   SpO2 96%   BMI 20.82 kg/m   Wt Readings from Last 3 Encounters:  01/15/23 129 lb (58.5 kg)  12/18/22 129 lb 6.4 oz (58.7 kg)  05/13/22 126 lb 3.2 oz (57.2 kg)    BP Readings from Last 3 Encounters:  01/15/23 130/80  12/18/22 (!) 135/94  05/13/22 (!) 159/95     Physical Exam Vitals and nursing note reviewed.  Constitutional:      General: She is not in acute distress.    Appearance: Normal appearance. She is not ill-appearing.     Comments: Febrile  HENT:     Head: Normocephalic.     Right Ear: Tympanic membrane, ear canal and external ear normal.     Left Ear: Tympanic membrane, ear canal and external ear normal.     Nose: Congestion present.     Mouth/Throat:     Mouth: Mucous membranes are moist.     Pharynx: No oropharyngeal exudate or posterior oropharyngeal erythema.  Eyes:     Extraocular  Movements: Extraocular movements intact.     Conjunctiva/sclera: Conjunctivae normal.     Pupils: Pupils are equal, round, and reactive to light.  Cardiovascular:     Rate and Rhythm: Normal rate and regular rhythm.     Pulses: Normal pulses.     Heart sounds: Normal heart sounds. No murmur heard. Pulmonary:     Effort: Pulmonary effort is normal. No respiratory distress.     Breath sounds: Normal breath sounds. No wheezing.  Musculoskeletal:     Cervical back: Normal range of motion.  Skin:    General: Skin is warm.  Neurological:     Mental Status: She is alert and oriented to person, place, and time.  Psychiatric:        Mood and Affect: Mood normal.        Behavior: Behavior normal.        Assessment & Plan:  Community acquired pneumonia, unspecified laterality -     Doxycycline Hyclate; Take 1 tablet (100 mg total) by mouth 2 (two) times daily for 5  days.  Dispense: 10 tablet; Refill: 0  Fever, unspecified fever cause -     POC COVID-19 BinaxNow -     POCT Influenza A/B -     Doxycycline Hyclate; Take 1 tablet (100 mg total) by mouth 2 (two) times daily for 5 days.  Dispense: 10 tablet; Refill: 0    Assessment and Plan    Suspected Pneumonia Fever, cough, and production of yellow phlegm. No shortness of breath or wheeziness. History of recent exposure to pneumonia in son's classroom. Son was treated successfully with azithromycin. -Start empiric treatment with doxycycline 100 mg BID x 5 days as she is low-risk pt -Continue supportive care with Mucinex and ibuprofen as needed. -Expect seeing improvement in symptoms within 24-48 hours. -ED if any red flag symptoms as discussed   Return if symptoms worsen or fail to improve.   Vernee Baines M Chianne Byrns, PA-C

## 2023-03-04 ENCOUNTER — Encounter (HOSPITAL_BASED_OUTPATIENT_CLINIC_OR_DEPARTMENT_OTHER): Payer: Self-pay | Admitting: Obstetrics & Gynecology

## 2023-04-24 ENCOUNTER — Other Ambulatory Visit: Payer: Self-pay | Admitting: Family Medicine

## 2023-04-24 DIAGNOSIS — I10 Essential (primary) hypertension: Secondary | ICD-10-CM

## 2023-04-29 ENCOUNTER — Other Ambulatory Visit (HOSPITAL_BASED_OUTPATIENT_CLINIC_OR_DEPARTMENT_OTHER): Payer: Self-pay | Admitting: Obstetrics & Gynecology

## 2023-05-22 ENCOUNTER — Other Ambulatory Visit: Payer: Self-pay | Admitting: Family Medicine

## 2023-06-12 ENCOUNTER — Ambulatory Visit: Payer: Self-pay | Admitting: Family Medicine

## 2023-06-12 NOTE — Telephone Encounter (Signed)
 Chief Complaint: Cold sore right corner of mouth Symptoms: cracked open skin, pain Frequency: intermittent since January Pertinent Negatives: Patient denies fever Disposition: [] ED /[] Urgent Care (no appt availability in office) / [x] Appointment(In office/virtual)/ []  [facility]/ [] Home Care/ [] Refused Recommended Disposition /[] [facility]/ []  Follow-up with PCP Additional Notes: Patient reports cold sore in right corner of mouth that has been intermittently reappearing since January. Patient has been treating this with fungal cream and stating it'll go away for a little bit but return. Patient would like further evaluation. Advised patient on home care instructions as well.   Copied from CRM [id number]. Topic: Clinical - Pink Word Triage >> Jun 12, 2023 10:02 AM [place] A wrote: Reason for Triage: Patient has a sore in the corner of right side of mouth in the crease area. Treating it with Anti-Fugal Cream the skin is broken with some blood Answer Assessment - Initial Assessment Questions 1. APPEARANCE of BLISTERS: "Describe the sores."     Open  2. SIZE: "How large an area is involved with the cold sores?" (e.g., inches, cm or compare to coins)     Quarter of inch  3. LOCATION: "Which part of the lip is involved?"     Right crease of mouth  4. ONSET: "When did the fever blisters begin?"     January on and off 5. RECURRENT BLISTERS: "Have you had fever blisters before?" If Yes, ask: "When was the last time?" "How many times a year?"     Not like this 6. OTHER SYMPTOMS: "Do you have any other symptoms?" (e.g., fever, sores inside mouth)     No  Protocols used: Cold Sores (Fever Blisters)-A-AH  Reason for Disposition  Sores last > 2 weeks  Answer Assessment - Initial Assessment Questions 1. APPEARANCE of BLISTERS: "Describe the sores."     Open  2. SIZE: "How large an area is involved with the cold sores?" (e.g., inches, cm or compare to coins)     Quarter of  inch  3. LOCATION: "Which part of the lip is involved?"     Right crease of mouth  4. ONSET: "When did the fever blisters begin?"     January on and off 5. RECURRENT BLISTERS: "Have you had fever blisters before?" If Yes, ask: "When was the last time?" "How many times a year?"     Not like this 6. OTHER SYMPTOMS: "Do you have any other symptoms?" (e.g., fever, sores inside mouth)     No  Protocols used: Cold Sores (Fever Blisters)-A-AH

## 2023-06-12 NOTE — Telephone Encounter (Signed)
    Additional Notes: This Triage RN Attempted to call the patient at this time, no answer, after one ring, received the call could not be completed as dialed message after several attempts with the given contact number.

## 2023-06-15 ENCOUNTER — Ambulatory Visit: Admitting: Family Medicine

## 2023-06-15 NOTE — Telephone Encounter (Signed)
 Please contact patient to schedule OV/VV with PCP

## 2023-06-17 ENCOUNTER — Other Ambulatory Visit: Payer: Self-pay | Admitting: Family Medicine

## 2023-06-20 ENCOUNTER — Other Ambulatory Visit: Payer: Self-pay | Admitting: Family Medicine

## 2023-09-21 ENCOUNTER — Other Ambulatory Visit: Payer: Self-pay | Admitting: Family Medicine

## 2023-10-24 ENCOUNTER — Other Ambulatory Visit: Payer: Self-pay | Admitting: Family Medicine

## 2023-10-24 DIAGNOSIS — I10 Essential (primary) hypertension: Secondary | ICD-10-CM

## 2023-11-10 ENCOUNTER — Encounter: Payer: Self-pay | Admitting: Advanced Practice Midwife

## 2023-11-10 ENCOUNTER — Other Ambulatory Visit: Payer: Self-pay

## 2023-11-10 DIAGNOSIS — N926 Irregular menstruation, unspecified: Secondary | ICD-10-CM

## 2023-11-10 MED ORDER — SLYND 4 MG PO TABS: ORAL_TABLET | ORAL | 1 refills | Status: DC

## 2023-11-19 ENCOUNTER — Other Ambulatory Visit: Payer: Self-pay | Admitting: Family Medicine

## 2023-11-19 DIAGNOSIS — I10 Essential (primary) hypertension: Secondary | ICD-10-CM

## 2023-11-21 ENCOUNTER — Other Ambulatory Visit: Payer: Self-pay | Admitting: Family Medicine

## 2023-12-17 ENCOUNTER — Other Ambulatory Visit: Payer: Self-pay | Admitting: Family Medicine

## 2023-12-21 ENCOUNTER — Other Ambulatory Visit: Payer: Self-pay | Admitting: Family Medicine

## 2024-01-12 ENCOUNTER — Other Ambulatory Visit: Payer: Self-pay | Admitting: Family Medicine

## 2024-01-16 ENCOUNTER — Other Ambulatory Visit: Payer: Self-pay | Admitting: Family Medicine

## 2024-01-25 ENCOUNTER — Ambulatory Visit (INDEPENDENT_AMBULATORY_CARE_PROVIDER_SITE_OTHER): Admitting: Family Medicine

## 2024-01-25 VITALS — BP 128/82 | HR 78 | Temp 98.3°F | Resp 14 | Ht 66.0 in | Wt 125.4 lb

## 2024-01-25 DIAGNOSIS — Z131 Encounter for screening for diabetes mellitus: Secondary | ICD-10-CM

## 2024-01-25 DIAGNOSIS — F419 Anxiety disorder, unspecified: Secondary | ICD-10-CM

## 2024-01-25 DIAGNOSIS — Z13220 Encounter for screening for lipoid disorders: Secondary | ICD-10-CM

## 2024-01-25 DIAGNOSIS — E039 Hypothyroidism, unspecified: Secondary | ICD-10-CM

## 2024-01-25 DIAGNOSIS — Z0001 Encounter for general adult medical examination with abnormal findings: Secondary | ICD-10-CM

## 2024-01-25 DIAGNOSIS — Z1211 Encounter for screening for malignant neoplasm of colon: Secondary | ICD-10-CM

## 2024-01-25 DIAGNOSIS — I10 Essential (primary) hypertension: Secondary | ICD-10-CM

## 2024-01-25 MED ORDER — LEVOTHYROXINE SODIUM 25 MCG PO TABS: 25.0000 ug | ORAL_TABLET | Freq: Every day | ORAL | 3 refills | Status: AC

## 2024-01-25 MED ORDER — SERTRALINE HCL 50 MG PO TABS: 50.0000 mg | ORAL_TABLET | Freq: Every day | ORAL | 3 refills | Status: AC

## 2024-01-25 MED ORDER — BUSPIRONE HCL 10 MG PO TABS: 10.0000 mg | ORAL_TABLET | Freq: Two times a day (BID) | ORAL | 3 refills | Status: AC

## 2024-01-25 MED ORDER — LOSARTAN POTASSIUM-HCTZ 100-25 MG PO TABS: 1.0000 | ORAL_TABLET | Freq: Every morning | ORAL | 3 refills | Status: AC

## 2024-01-25 NOTE — Assessment & Plan Note (Signed)
 Blood pressure at goal today on current regimen losartan /HCTZ 100-25 once daily.  Will check labs today.  She will monitor at home and let us  know if persistently elevated.

## 2024-01-25 NOTE — Assessment & Plan Note (Signed)
 Stable on  Zoloft  50 mg daily and BuSpar  10 mg twice daily.  Will refill today.

## 2024-01-25 NOTE — Patient Instructions (Signed)
 It was very nice to see you today!  VISIT SUMMARY: Today, you had your annual physical exam. We discussed your occasional musculoskeletal pain, exercise routine, diet, and family history of illness. We also reviewed your need for colorectal cancer screening and general health maintenance.  YOUR PLAN: MUSCULOSKELETAL ACHES AND PAINS: You experience occasional musculoskeletal pain, likely due to age-related wear and tear. -Rest, ice, compression, and elevation for a few days. -Use ibuprofen, naproxen, or acetaminophen  as needed. -Return for evaluation if pain persists beyond a few days or is unusually severe. -Continue cardiovascular and resistance training for health and injury prevention.  COLORECTAL CANCER SCREENING: You are due for colorectal cancer screening. -We discussed Cologuard as a convenient, non-invasive alternative to colonoscopy. -Order Cologuard for colon cancer screening.  GENERAL HEALTH MAINTENANCE: Routine wellness visit to review dietary habits and exercise importance for musculoskeletal and cardiovascular health. -Order comprehensive blood panel including thyroid , A1c, cholesterol, blood counts, liver function, and electrolytes. -Refill medications as needed.  Return in about 1 year (around 01/24/2025) for Annual Physical.   Take care, Dr Kennyth  PLEASE NOTE:  If you had any lab tests, please let us  know if you have not heard back within a few days. You may see your results on mychart before we have a chance to review them but we will give you a call once they are reviewed by us .   If we ordered any referrals today, please let us  know if you have not heard from their office within the next week.   If you had any urgent prescriptions sent in today, please check with the pharmacy within an hour of our visit to make sure the prescription was transmitted appropriately.   Please try these tips to maintain a healthy lifestyle:  Eat at least 3 REAL meals and 1-2 snacks  per day.  Aim for no more than 5 hours between eating.  If you eat breakfast, please do so within one hour of getting up.   Each meal should contain half fruits/vegetables, one quarter protein, and one quarter carbs (no bigger than a computer mouse)  Cut down on sweet beverages. This includes juice, soda, and sweet tea.   Drink at least 1 glass of water with each meal and aim for at least 8 glasses per day  Exercise at least 150 minutes every week.     Preventive Care 60-66 Years Old, Female Preventive care refers to lifestyle choices and visits with your health care provider that can promote health and wellness. Preventive care visits are also called wellness exams. What can I expect for my preventive care visit? Counseling Your health care provider may ask you questions about your: Medical history, including: Past medical problems. Family medical history. Pregnancy history. Current health, including: Menstrual cycle. Method of birth control. Emotional well-being. Home life and relationship well-being. Sexual activity and sexual health. Lifestyle, including: Alcohol, nicotine or tobacco, and drug use. Access to firearms. Diet, exercise, and sleep habits. Work and work astronomer. Sunscreen use. Safety issues such as seatbelt and bike helmet use. Physical exam Your health care provider will check your: Height and weight. These may be used to calculate your BMI (body mass index). BMI is a measurement that tells if you are at a healthy weight. Waist circumference. This measures the distance around your waistline. This measurement also tells if you are at a healthy weight and may help predict your risk of certain diseases, such as type 2 diabetes and high blood pressure. Heart rate and  blood pressure. Body temperature. Skin for abnormal spots. What immunizations do I need?  Vaccines are usually given at various ages, according to a schedule. Your health care provider will  recommend vaccines for you based on your age, medical history, and lifestyle or other factors, such as travel or where you work. What tests do I need? Screening Your health care provider may recommend screening tests for certain conditions. This may include: Lipid and cholesterol levels. Diabetes screening. This is done by checking your blood sugar (glucose) after you have not eaten for a while (fasting). Pelvic exam and Pap test. Hepatitis B test. Hepatitis C test. HIV (human immunodeficiency virus) test. STI (sexually transmitted infection) testing, if you are at risk. Lung cancer screening. Colorectal cancer screening. Mammogram. Talk with your health care provider about when you should start having regular mammograms. This may depend on whether you have a family history of breast cancer. BRCA-related cancer screening. This may be done if you have a family history of breast, ovarian, tubal, or peritoneal cancers. Bone density scan. This is done to screen for osteoporosis. Talk with your health care provider about your test results, treatment options, and if necessary, the need for more tests. Follow these instructions at home: Eating and drinking  Eat a diet that includes fresh fruits and vegetables, whole grains, lean protein, and low-fat dairy products. Take vitamin and mineral supplements as recommended by your health care provider. Do not drink alcohol if: Your health care provider tells you not to drink. You are pregnant, may be pregnant, or are planning to become pregnant. If you drink alcohol: Limit how much you have to 0-1 drink a day. Know how much alcohol is in your drink. In the [place] one drink equals one 12 oz bottle of beer (355 mL), one 5 oz glass of wine (148 mL), or one 1 oz glass of hard liquor (44 mL). Lifestyle Brush your teeth every morning and night with fluoride toothpaste. Floss one time each day. Exercise for at least 30 minutes 5 or more days each week. Do  not use any products that contain nicotine or tobacco. These products include cigarettes, chewing tobacco, and vaping devices, such as e-cigarettes. If you need help quitting, ask your health care provider. Do not use drugs. If you are sexually active, practice safe sex. Use a condom or other form of protection to prevent STIs. If you do not wish to become pregnant, use a form of birth control. If you plan to become pregnant, see your health care provider for a prepregnancy visit. Take aspirin only as told by your health care provider. Make sure that you understand how much to take and what form to take. Work with your health care provider to find out whether it is safe and beneficial for you to take aspirin daily. Find healthy ways to manage stress, such as: Meditation, yoga, or listening to music. Journaling. Talking to a trusted person. Spending time with friends and family. Minimize exposure to UV radiation to reduce your risk of skin cancer. Safety Always wear your seat belt while driving or riding in a vehicle. Do not drive: If you have been drinking alcohol. Do not ride with someone who has been drinking. When you are tired or distracted. While texting. If you have been using any mind-altering substances or drugs. Wear a helmet and other protective equipment during sports activities. If you have firearms in your house, make sure you follow all gun safety procedures. Seek help if you have  been physically or sexually abused. What's next? Visit your health care provider once a year for an annual wellness visit. Ask your health care provider how often you should have your eyes and teeth checked. Stay up to date on all vaccines. This information is not intended to replace advice given to you by your health care provider. Make sure you discuss any questions you have with your health care provider. Document Revised: 09/12/2020 Document Reviewed: 09/12/2020 Elsevier Patient Education  2024  [employer].

## 2024-01-25 NOTE — Assessment & Plan Note (Signed)
 On Synthroid  25 mcg daily.  Check TSH today.

## 2024-01-25 NOTE — Progress Notes (Signed)
 Chief Complaint:  Cheryl Christian is a 43 y.o. female who presents today for her annual comprehensive physical exam.    Assessment/Plan:  New/Acute Problems: Assessment & Plan Musculoskeletal aches and pains   Likely due to age-related wear and tear. Recommend rest, ice, compression, and elevation for a few days. Use ibuprofen, naproxen, or acetaminophen  as needed. Return for evaluation if pain persists beyond a few days or is unusually severe. Emphasize cardiovascular and resistance training for health and injury prevention.    Chronic Problems Addressed Today: Essential hypertension Blood pressure at goal today on current regimen losartan /HCTZ 100-25 once daily.  Will check labs today.  She will monitor at home and let us  know if persistently elevated.  Hypothyroid On Synthroid  25 mcg daily.  Check TSH today.  Anxiety Stable on  Zoloft  50 mg daily and BuSpar  10 mg twice daily.  Will refill today.   Preventative Healthcare: Check labs.  Follows with gynecology for [facility].  Will order Cologuard today.  Up-to-date on vaccines.  Patient Counseling(The following topics were reviewed and/or handout was given):  -Nutrition: Stressed importance of moderation in sodium/caffeine intake, saturated fat and cholesterol, caloric balance, sufficient intake of fresh fruits, vegetables, and fiber.  -Stressed the importance of regular exercise.   -Substance Abuse: Discussed cessation/primary prevention of tobacco, alcohol, or other drug use; driving or other dangerous activities under the influence; availability of treatment for abuse.   -Injury prevention: Discussed safety belts, safety helmets, smoke detector, smoking near bedding or upholstery.   -Sexuality: Discussed sexually transmitted diseases, partner selection, use of condoms, avoidance of unintended pregnancy and contraceptive alternatives.   -Dental health: Discussed importance of regular tooth brushing, flossing, and  dental visits.  -Health maintenance and immunizations reviewed. Please refer to Health maintenance section.  Return to care in 1 year for next preventative visit.     Subjective:  HPI:  She has no acute complaints today. Patient is here today for her annual physical.  See assessment / plan for status of chronic conditions.  Discussed the use of AI scribe software for clinical note transcription with the patient, who gave verbal consent to proceed.  History of Present Illness [name] is a 43 year old female who presents for an annual physical exam.  She experiences occasional musculoskeletal pain, which she attributes to staying active and sometimes overexerting herself. She is concerned about distinguishing between normal aches and pains and those that require medical attention.  She maintains a regular exercise routine, including 25 minutes of cardio and 15 minutes of weight training with dumbbells during the week. Her focus is on upper and lower body and core exercises to prevent muscle atrophy, particularly in her back and legs.  In terms of diet, she has started taking Metamucil to increase her fiber intake.  She recalls a family episode of bacterial pneumonia in October, which affected her ability to keep up with her health maintenance schedule. The illness was severe but has since resolved.     01/25/2024    9:32 AM  Depression screen PHQ 2/9  Decreased Interest 0  Down, Depressed, Hopeless 0  PHQ - 2 Score 0    Health Maintenance Due  Topic Date Due   Fecal DNA (Cologuard)  Never done     ROS: Per HPI, otherwise a complete review of systems was negative.   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   Preeclampsia    Patient Active Problem List   Diagnosis Date Noted   Extremely  dense tissue of both breasts on mammography 12/19/2022   Irregular bleeding 11/29/2020   Other endometriosis 10/04/2020   Anxiety 10/04/2020   Verruca 01/27/2020   Essential hypertension 06/22/2018   Hypothyroid 05/20/2018   Past Surgical History:  Procedure Laterality Date   EXCISION MASS ABDOMINAL Right 04/12/2019   Procedure: EXCISION ABDOMINAL WALL MASS;  Surgeon: Ebbie Cough, MD;  Location: [facility];  Service: General;  Laterality: Right;  GENERAL AND TAP BLOCK ANESTHESIA   WISDOM TOOTH EXTRACTION     43 yo    Family History  Problem Relation Age of Onset   Hypertension Mother    Lung disease Mother        lymphangioleimyomatosis (LAMB), s/p transplant   Hypertension Father    Thyroid  disease Sister    Diabetes Paternal Grandfather     Medications- reviewed and updated Current Outpatient Medications  Medication Sig Dispense Refill   Drospirenone  (SLYND ) 4 MG TABS Daily 28 tablet 1   estradiol  (ESTRACE ) 0.1 MG/GM vaginal cream PLACE 1 GRAM VAGINALLY TWICE WEEKLY AND APPLY TOPICALLY TO SKIN TWICE WEEKLY. 42.5 g 2   ibuprofen (ADVIL) 400 MG tablet Take 400 mg by mouth every 6 (six) hours as needed for headache.     busPIRone  (BUSPAR ) 10 MG tablet Take 1 tablet (10 mg total) by mouth 2 (two) times daily. 180 tablet 3   levothyroxine  (SYNTHROID ) 25 MCG tablet Take 1 tablet (25 mcg total) by mouth daily. 90 tablet 3   losartan -hydrochlorothiazide  (HYZAAR) 100-25 MG tablet Take 1 tablet by mouth every morning. 90 tablet 3   sertraline  (ZOLOFT ) 50 MG tablet Take 1 tablet (50 mg total) by mouth daily. 90 tablet 3   No current facility-administered medications for this visit.    Allergies-reviewed and updated No Known Allergies  Social History   Socioeconomic History   Marital status: Married    Spouse name: Not on file   Number of children: Not on file   Years of education: Not on file   Highest education level: Doctorate  Occupational History    Not on file  Tobacco Use   Smoking status: Never   Smokeless tobacco: Never  Vaping Use   Vaping status: Never Used  Substance and Sexual Activity   Alcohol use: Yes    Alcohol/week: 12.0 - 14.0 standard drinks of alcohol    Types: 12 - 14 Standard drinks or equivalent per week   Drug use: Never   Sexual activity: Yes    Birth control/protection: Surgical    Comment: Husband has vasectomy  Other Topics Concern   Not on file  Social History Narrative   Not on file   Social Drivers of Health   Financial Resource Strain: Low Risk  (01/25/2024)   Overall Financial Resource Strain (CARDIA)    Difficulty of Paying Living Expenses: Not hard at all  Food Insecurity: No Food Insecurity (01/25/2024)   Hunger Vital Sign    Worried About Running Out of Food in the Last Year: Never true    Ran Out of Food in the Last Year: Never true  Transportation Needs: No Transportation Needs (01/25/2024)   PRAPARE - Administrator, Civil Service (Medical): No    Lack of Transportation (Non-Medical): No  Physical Activity: Sufficiently Active (01/25/2024)   Exercise Vital Sign  Days of Exercise per Week: 5 days    Minutes of Exercise per Session: 30 min  Stress: Stress Concern Present (01/25/2024)   [employer] of Occupational Health - Occupational Stress Questionnaire    Feeling of Stress: To some extent  Social Connections: Socially Isolated (01/25/2024)   Social Connection and Isolation Panel    Frequency of Communication with Friends and Family: Once a week    Frequency of Social Gatherings with Friends and Family: Once a week    Attends Religious Services: Never    Diplomatic Services Operational Officer: No    Attends Engineer, Structural: Not on file    Marital Status: Married        Objective:  Physical Exam: BP 128/82   Pulse 78   Temp 98.3 F (36.8 C) (Temporal)   Resp 14   Ht 5' 6 (1.676 m)   Wt 125 lb 6.4 oz (56.9 kg)   LMP 01/11/2024 (Exact  Date)   SpO2 98%   BMI 20.24 kg/m   Body mass index is 20.24 kg/m. Wt Readings from Last 3 Encounters:  01/25/24 125 lb 6.4 oz (56.9 kg)  01/15/23 129 lb (58.5 kg)  12/18/22 129 lb 6.4 oz (58.7 kg)   Gen: NAD, resting comfortably HEENT: TMs normal bilaterally. OP clear. No thyromegaly noted.  CV: RRR with no murmurs appreciated Pulm: NWOB, CTAB with no crackles, wheezes, or rhonchi GI: Normal bowel sounds present. Soft, Nontender, Nondistended. MSK: no edema, cyanosis, or clubbing noted Skin: warm, dry Neuro: CN2-12 grossly intact. Strength 5/5 in upper and lower extremities. Reflexes symmetric and intact bilaterally.  Psych: Normal affect and thought content     Gresham Caetano M. Kennyth, MD 01/25/2024 10:10 AM

## 2024-02-07 LAB — COLOGUARD: COLOGUARD: NEGATIVE

## 2024-02-08 ENCOUNTER — Ambulatory Visit: Payer: Self-pay | Admitting: Family Medicine

## 2024-02-08 NOTE — Progress Notes (Signed)
Great news! Cologuard is negative. We can repeat in 3 years.

## 2024-02-10 ENCOUNTER — Other Ambulatory Visit (HOSPITAL_BASED_OUTPATIENT_CLINIC_OR_DEPARTMENT_OTHER): Payer: Self-pay | Admitting: Family Medicine

## 2024-02-10 ENCOUNTER — Ambulatory Visit: Payer: Self-pay

## 2024-02-10 DIAGNOSIS — Z1231 Encounter for screening mammogram for malignant neoplasm of breast: Secondary | ICD-10-CM

## 2024-02-10 NOTE — Telephone Encounter (Signed)
 FYI Only or Action Required?: FYI only for provider: appointment scheduled on 02/11/2024.  Patient was last seen in primary care on 01/25/2024 by Kennyth Worth HERO, MD.  Called Nurse Triage reporting Pain.  Symptoms began x 2 weeks.  Interventions attempted: Rest, hydration, or home remedies.  Symptoms are: unchanged.  Triage Disposition: See PCP When Office is Open (Within 3 Days)  Patient/caregiver understands and will follow disposition?: Yes    Copied from CRM #[id number]. Topic: Clinical - Red Word Triage >> Feb 10, 2024 10:36 AM Rea ORN wrote: Red Word that prompted transfer to Nurse Triage: right elbow pain. Pt would like to know if she should see PCP or be referred to ortho Reason for Disposition  [1] MODERATE pain (e.g., interferes with normal activities, limping) AND [2] present > 3 days  Answer Assessment - Initial Assessment Questions 1. ONSET: When did the pain start?      X 2 weeks 2. LOCATION: Where is the pain located?      Right elbow  3. PAIN: How bad is the pain?    (Scale 1-10; or mild, moderate, severe)     6/10 4. 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 CAUSE: What do you think is causing the leg pain?     unsure 6. OTHER SYMPTOMS: Do you have any other symptoms? (e.g., chest pain, back pain, breathing difficulty, swelling, rash, fever, numbness, weakness)     no 7. PREGNANCY: Is there any chance you are pregnant? When was your last menstrual period?     na  Radiates into forearm  Protocols used: Leg Pain-A-AH

## 2024-02-11 ENCOUNTER — Ambulatory Visit (INDEPENDENT_AMBULATORY_CARE_PROVIDER_SITE_OTHER): Admitting: Family Medicine

## 2024-02-11 ENCOUNTER — Encounter: Payer: Self-pay | Admitting: Family Medicine

## 2024-02-11 VITALS — BP 126/74 | HR 88 | Temp 98.1°F | Ht 66.0 in | Wt 124.6 lb

## 2024-02-11 DIAGNOSIS — M7711 Lateral epicondylitis, right elbow: Secondary | ICD-10-CM | POA: Diagnosis not present

## 2024-02-11 MED ORDER — NITROGLYCERIN 0.1 MG/HR TD PT24: MEDICATED_PATCH | TRANSDERMAL | 0 refills | Status: AC

## 2024-02-11 MED ORDER — DICLOFENAC SODIUM 75 MG PO TBEC
75.0000 mg | DELAYED_RELEASE_TABLET | Freq: Two times a day (BID) | ORAL | 0 refills | Status: AC
Start: 2024-02-11 — End: ?

## 2024-02-11 NOTE — Patient Instructions (Signed)
 Please follow up if symptoms do not improve or as needed.     VISIT SUMMARY: Today, you were seen for elbow pain that started two weeks ago and has been affecting your daily activities and physical routines. The pain has not improved with rest, braces, or ibuprofen.  YOUR PLAN: -LATERAL EPICONDYLITIS (TENNIS ELBOW), RIGHT ELBOW: Lateral epicondylitis, commonly known as tennis elbow, is an inflammation of the tendons that join the forearm muscles on the outside of the elbow. This condition is often caused by repetitive motions. You should apply ice to the affected area two to three times a day for ten minutes each session, use a wrist splint to reduce strain on the elbow, and apply Voltaren  (diclofenac ) gel twice daily. Additionally, you should use a nitroglycerin patch cut to a quarter size once daily to improve blood flow. Continue taking ibuprofen with food to protect your stomach, and consider using Pepcid if you experience any stomach issues. If your symptoms do not improve in two weeks, we may consider a steroid injection or prednisone as further treatment options.  INSTRUCTIONS: Please follow up in two weeks to reassess your symptoms. If your pain does not improve or worsens, contact our office sooner.  Tennis Elbow: What to Know  Tennis elbow, also called lateral epicondylitis, is the inflammation of the tendons in your outer forearm, near your elbow. Tendons are tissues that connect muscle to bone. When you have tennis elbow, the tendons you use to bend your wrist and hand backward get inflamed. Tennis elbow often affects people who play tennis, but it can happen to anyone who moves their wrist, arm, or hands in the same way over and over. What are the causes? Tennis elbow is often caused by: Repeatedly extending the wrist, turning the forearm, or using the hands. Playing sports or doing tasks that involve repetitive forearm movements. Sudden injury. What increases the risk? You're more  likely to get tennis elbow if you play tennis or another racket sport. But, it can affect anyone who uses their hands a lot. Other people at greater risk include: People who use computers. Holiday Representative workers. Factory workers. Musicians. Cooks. Cashiers. What are the signs or symptoms? Pain and tenderness along the outside of your forearm and elbow. It might hurt all of the time or only when you use your arm. A burning feeling that starts in the elbow and spreads down the arm. A weak grip. How is this diagnosed? This condition is diagnosed based on your symptoms, medical history, and a physical exam. You may also have X-rays or an MRI to: Confirm the diagnosis. Look for other issues. Check for tears in the ligaments, muscles, or tendons. How is this treated? Treatment may include: Resting and icing the arm. Taking NSAIDs, such as ibuprofen. Getting steroid shots to lessen pain and swelling. Doing physical therapy exercises to improve movement and strength in your arm. Wearing an elbow brace or wrist strap to limit movements that cause symptoms. Surgery, if other treatments don't work. This is rare. Follow these instructions at home: If you have a brace or strap: Wear the brace or strap as told. Take it off only if your health care provider says you can. Check the skin around it every day. Tell your provider if you see problems. Loosen the brace or strap if your fingers tingle, become numb, or turn cold and blue. Keep the brace clean. If the brace or strap isn't waterproof: Do not let it get wet. Cover it when you  take a bath or a shower. Use a cover that doesn't let any water in. Managing pain, stiffness, and swelling  Use ice or an ice pack as told. If you have a brace or strap that you can take off, remove it only as told. Put ice in a plastic bag. Place a towel between your skin and the ice. Leave the ice on for 20 minutes, 2-3 times a day. If your skin turns red, take  off the ice right away to prevent skin damage. The risk of damage is higher if you can't feel pain, heat, or cold. Move your fingers often to reduce stiffness and swelling. Raise your arm above the level of your heart while you're sitting or lying down. Use pillows as needed. Activity Rest your elbow and wrist. Avoid the activity that causes your symptoms. Do physical therapy exercises as told by your provider. Lift objects with the palm of your hand facing up. This lessens stress on your elbow. Lifestyle If your tennis elbow is caused by sports, check your sports equipment. Make sure you're using it right and it fits well. If your tennis elbow is caused by work or computer use, take breaks often to stretch your arm. Talk with your employer about how to make your condition better at work. General instructions Take your medicines only as told. Do not smoke, vape, or use nicotine or tobacco. Doing this can slow down healing. How is this prevented? Warm up and stretch before being active. Cool down and stretch after being active. Give your body time to rest. Use equipment that fits you. If you play tennis, put power in your stroke with your lower body. Avoid using your arm only. Stay fit. Keep your body strong and flexible. Do exercises to strengthen the forearm muscles. Contact a health care provider if: You have pain that gets worse. Your pain doesn't get better with treatment. You have numbness or weakness in your forearm, hand, or fingers. Get help right away if: Your pain is very bad. You can't move your wrist. This information is not intended to replace advice given to you by your health care provider. Make sure you discuss any questions you have with your health care provider. Document Revised: 11/10/2022 Document Reviewed: 11/10/2022 Elsevier Patient Education  2024 [employer]                     Contains text generated by [employer].                                  Contains text generated by [employer].

## 2024-02-11 NOTE — Telephone Encounter (Signed)
 Appt today

## 2024-02-11 NOTE — Progress Notes (Signed)
 Subjective  CC:  Chief Complaint  Patient presents with   Elbow Pain    Pt stated that she had some Rt elbow pain since Halloween and is about the same and gets worse when she use it. It comes down to her forearm   Same day acute visit; PCP not available. New pt to me. Chart reviewed.   HPI: Cheryl Christian is a 43 y.o. female who presents to the office today to address the problems listed above in the chief complaint. Discussed the use of AI scribe software for clinical note transcription with the patient, who gave verbal consent to proceed.  History of Present Illness Cheryl Christian is a 43 year old female who presents with elbow pain.  Elbow pain - Onset two weeks ago, associated with activities such as pulling weeds and carving pumpkins - Pain localized to the elbow, occasionally radiates down the arm - Exacerbated by holding objects such as a phone or cup - Not worsened by applying pressure or resistance - Worsening pain despite use of brace, rest, and ibuprofen - No relief from current management strategies including multiple braces - Pain more noticeable during daily activities - No associated shoulder pain  Impact on physical activity and daily function - Significant reduction in weightlifting routine due to pain, from bench pressing 45 pounds to using two-pound weights - Household activities such as vacuuming contribute to discomfort  Analgesic use and prior musculoskeletal history - Ibuprofen use: 4 to 6 tablets per day, four days per week - Previously took 800 mg ibuprofen daily for 'baby wrist' - Currently reduced ibuprofen intake to follow bottle instructions - Multiple braces tried, found ineffective   Assessment  1. Right lateral epicondylitis      Plan  Assessment and Plan Assessment & Plan Lateral epicondylitis (tennis elbow), right elbow Lateral epicondylitis of the right elbow, likely due to repetitive motions such as pulling weeds and  carving pumpkins. Symptoms have persisted for approximately two to three weeks, with pain exacerbated by certain movements and activities. Previous conservative management with rest, ibuprofen, and braces has been insufficient. Examination reveals tenderness at the lateral elbow with pain on resisted wrist extension, consistent with lateral epicondylitis. The condition involves inflammation of the periosteum at the lateral elbow, which is challenging to treat due to limited blood flow in the area. - Apply ice to the affected area two to three times a day for ten minutes each session. - Use a wrist splint to immobilize the wrist and reduce strain on the elbow. - Prescribed Voltaren  (diclofenac )to be taken twice daily. Take with food - Prescribed a nitroglycerin patch to be applied once daily, cut to a quarter patch size, to improve blood flow to the area. - Advised to continue taking nsaid with food to protect the stomach, and consider using Pepcid if gastrointestinal symptoms occur. - If symptoms do not improve in 2 -6 weeks, will consider a steroid injection or prednisone as further treatment options.  Follow up: prn No orders of the defined types were placed in this encounter.  Meds ordered this encounter  Medications   nitroGLYCERIN (NITRODUR - DOSED IN MG/24 HR) 0.1 mg/hr patch    Sig: Place 1/4 patch onto tender area of right elbow once a day    Dispense:  30 patch    Refill:  0   diclofenac  (VOLTAREN ) 75 MG EC tablet    Sig: Take 1 tablet (75 mg total) by mouth 2 (two) times daily.  Dispense:  30 tablet    Refill:  0     I reviewed the patients updated PMH, FH, and SocHx.  Patient Active Problem List   Diagnosis Date Noted   Extremely dense tissue of both breasts on mammography 12/19/2022   Irregular bleeding 11/29/2020   Other endometriosis 10/04/2020   Anxiety 10/04/2020   Verruca 01/27/2020   Essential hypertension 06/22/2018   Hypothyroid 05/20/2018   Current Meds   Medication Sig   busPIRone  (BUSPAR ) 10 MG tablet Take 1 tablet (10 mg total) by mouth 2 (two) times daily.   diclofenac  (VOLTAREN ) 75 MG EC tablet Take 1 tablet (75 mg total) by mouth 2 (two) times daily.   Drospirenone  (SLYND ) 4 MG TABS Daily   estradiol  (ESTRACE ) 0.1 MG/GM vaginal cream PLACE 1 GRAM VAGINALLY TWICE WEEKLY AND APPLY TOPICALLY TO SKIN TWICE WEEKLY.   ibuprofen (ADVIL) 400 MG tablet Take 400 mg by mouth every 6 (six) hours as needed for headache.   levothyroxine  (SYNTHROID ) 25 MCG tablet Take 1 tablet (25 mcg total) by mouth daily.   losartan -hydrochlorothiazide  (HYZAAR) 100-25 MG tablet Take 1 tablet by mouth every morning.   nitroGLYCERIN (NITRODUR - DOSED IN MG/24 HR) 0.1 mg/hr patch Place 1/4 patch onto tender area of right elbow once a day   sertraline  (ZOLOFT ) 50 MG tablet Take 1 tablet (50 mg total) by mouth daily.   Allergies: Patient has no known allergies. Family History: Patient family history includes Diabetes in her paternal grandfather; Hypertension in her father and mother; Lung disease in her mother; Thyroid  disease in her sister. Social History:  Patient  reports that she has never smoked. She has never used smokeless tobacco. She reports current alcohol use of about 12.0 - 14.0 standard drinks of alcohol per week. She reports that she does not use drugs.  Review of Systems: Constitutional: Negative for fever malaise or anorexia Cardiovascular: negative for chest pain Respiratory: negative for SOB or persistent cough Gastrointestinal: negative for abdominal pain  Objective  Vitals: BP 126/74   Pulse 88   Temp 98.1 F (36.7 C)   Ht 5' 6 (1.676 m)   Wt 124 lb 9.6 oz (56.5 kg)   LMP 01/11/2024 (Exact Date)   SpO2 99%   BMI 20.11 kg/m  General: no acute distress , A&Ox3 Right elbow: nl appearance. Tender over lateral epicondyle and pain with resisted supination. Nl grip. Nl rom. No medial pain Commons side effects, risks, benefits, and alternatives  for medications and treatment plan prescribed today were discussed, and the patient expressed understanding of the given instructions. Patient is instructed to call or message via MyChart if he/she has any questions or concerns regarding our treatment plan. No barriers to understanding were identified. We discussed Red Flag symptoms and signs in detail. Patient expressed understanding regarding what to do in case of urgent or emergency type symptoms.  Medication list was reconciled, printed and provided to the patient in AVS. Patient instructions and summary information was reviewed with the patient as documented in the AVS. This note was prepared with assistance of Dragon voice recognition software. Occasional wrong-word or sound-a-like substitutions may have occurred due to the inherent limitations of voice recognition software

## 2024-02-21 ENCOUNTER — Other Ambulatory Visit: Payer: Self-pay | Admitting: Family Medicine

## 2024-03-05 ENCOUNTER — Other Ambulatory Visit: Payer: Self-pay | Admitting: Medical Genetics

## 2024-03-11 ENCOUNTER — Inpatient Hospital Stay (HOSPITAL_BASED_OUTPATIENT_CLINIC_OR_DEPARTMENT_OTHER)
Admission: RE | Admit: 2024-03-11 | Discharge: 2024-03-11 | Disposition: A | Source: Ambulatory Visit | Attending: Family Medicine | Admitting: Family Medicine

## 2024-03-11 ENCOUNTER — Encounter (HOSPITAL_BASED_OUTPATIENT_CLINIC_OR_DEPARTMENT_OTHER): Payer: Self-pay | Admitting: Radiology

## 2024-03-11 DIAGNOSIS — Z1231 Encounter for screening mammogram for malignant neoplasm of breast: Secondary | ICD-10-CM

## 2024-04-04 NOTE — Progress Notes (Addendum)
 "  ANNUAL EXAM Patient name: Cheryl Christian MRN [id number]  Date of birth: [birth year] Chief Complaint:   Gynecologic Exam  History of Present Illness:   Cheryl Christian is a 44 y.o. [phone] Caucasian female being seen today for a routine annual exam.  Cycles are fairly regular.  She stopped Slynd  a few months ago and has considered stopping it altogether but has decided she would like to restart.    Patient's last menstrual period was 03/01/2024.  Last pap 12/09/2021. Results were: NILM w/ HRHPV negative. H/O abnormal pap: no Last mammogram: 03/11/2024. Results were: normal. Family h/o breast cancer: no Last colonoscopy: declined colonoscopy for now.  Cologuard negative 01/2024.  Repeat 3 years.  Family h/o colorectal cancer: yes maternal great uncle     04/05/2024    1:36 PM 02/11/2024   11:23 AM 01/25/2024    9:32 AM 05/13/2022   10:25 AM 12/09/2021    8:53 AM  Depression screen PHQ 2/9  Decreased Interest 0 0 0 0 0  Down, Depressed, Hopeless 0 0 0 0 0  PHQ - 2 Score 0 0 0 0 0        02/11/2024   11:23 AM 01/03/2020    8:36 AM 11/11/2019    9:54 AM 06/22/2018   11:11 AM  GAD 7 : Generalized Anxiety Score  Nervous, Anxious, on Edge 0 0 0 1  Control/stop worrying 0 0 0 0  Worry too much - different things 0 0 0 1  Trouble relaxing 0 0 0 0  Restless 0 0 0 0  Easily annoyed or irritable 0 1 1 1   Afraid - awful might happen 0 0 0 0  Total GAD 7 Score 0 1 1 3   Anxiety Difficulty Not difficult at all Not difficult at all  Somewhat difficult    Review of Systems:   Pertinent items are noted in HPI Denies any bowel or bladder change. Denies pelvic pain.   Pertinent History Reviewed:  Reviewed past medical,surgical, social and family history.  Reviewed problem list, medications and allergies. Physical Assessment:   Vitals:   04/05/24 1333  BP: 126/82  Pulse: 69  SpO2: 100%  Weight: 129 lb 6.4 oz (58.7 kg)  Height: 5' 6 (1.676 m)  Body mass index is 20.89  kg/m.        Physical Examination:   General appearance - well appearing, and in no distress  Mental status - alert, oriented to person, place, and time  Psych:  She has a normal mood and affect  Skin - warm and dry, normal color, no suspicious lesions noted  Chest - effort normal, all lung fields clear to auscultation bilaterally  Heart - normal rate and regular rhythm  Neck:  midline trachea, no thyromegaly or nodules  Breasts - breasts appear normal, no suspicious masses, no skin or nipple changes or  axillary nodes  Abdomen - soft, nontender, nondistended, no masses or organomegaly  Pelvic - VULVA: normal appearing vulva with no masses, tenderness or lesions   VAGINA: normal appearing vagina with normal color and discharge, no lesions   CERVIX: normal appearing cervix without discharge or lesions, no CMT  Thin prep pap is not indicated  UTERUS: uterus is felt to be normal size, shape, consistency and nontender   Rectal - deferred  Extremities:  No swelling or varicosities noted  Chaperone present for exam  No results found for this or any previous visit (from the past 24 hours).  Assessment &  Plan:  1. Well woman exam with routine gynecological exam (Primary) - Pap smear 11/2024 - Mammogram 12/12 - Colonoscopy declined.  Cologuard negative in 211/2025.  Pt aware to  - lab work done with PCP, Dr. Kennyth - vaccines reviewed/updated  2. History of irregular menstrual bleeding - Drospirenone  (SLYND ) 4 MG TABS; Daily  Dispense: 84 tablet; Refill: 4  3. Breast density - Contrast enhanded mammogram discussed.  Will need to place order next fall.  Discussed with pt and advised to call around early October for new MMG order.    4. History of endometriosis  5. Essential hypertension  6. Generalized anxiety d/o - had positive GAD 7 today but pt feels this I under good control at this time    No orders of the defined types were placed in this encounter.   Meds:  Meds ordered  this encounter  Medications   Drospirenone  (SLYND ) 4 MG TABS    Sig: Daily    Dispense:  84 tablet    Refill:  4   estradiol  (ESTRACE ) 0.01 % CREA vaginal cream    Sig: 1 gram vaginally twice weekly    Dispense:  42.5 g    Refill:  6    Follow-up: Return in about 1 year (around 04/05/2025).  Ronal GORMAN Pinal, MD 04/05/2024 2:51 PM "

## 2024-04-05 ENCOUNTER — Ambulatory Visit (HOSPITAL_BASED_OUTPATIENT_CLINIC_OR_DEPARTMENT_OTHER): Admitting: Obstetrics & Gynecology

## 2024-04-05 ENCOUNTER — Encounter (HOSPITAL_BASED_OUTPATIENT_CLINIC_OR_DEPARTMENT_OTHER): Payer: Self-pay | Admitting: Obstetrics & Gynecology

## 2024-04-05 VITALS — BP 126/82 | HR 69 | Ht 66.0 in | Wt 129.4 lb

## 2024-04-05 DIAGNOSIS — Z01419 Encounter for gynecological examination (general) (routine) without abnormal findings: Secondary | ICD-10-CM | POA: Diagnosis not present

## 2024-04-05 DIAGNOSIS — R9230 Dense breasts, unspecified: Secondary | ICD-10-CM

## 2024-04-05 DIAGNOSIS — I10 Essential (primary) hypertension: Secondary | ICD-10-CM | POA: Diagnosis not present

## 2024-04-05 DIAGNOSIS — Z1331 Encounter for screening for depression: Secondary | ICD-10-CM

## 2024-04-05 DIAGNOSIS — Z8742 Personal history of other diseases of the female genital tract: Secondary | ICD-10-CM | POA: Diagnosis not present

## 2024-04-05 MED ORDER — ESTRADIOL 0.01 % VA CREA: TOPICAL_CREAM | VAGINAL | 6 refills | Status: AC

## 2024-04-05 MED ORDER — SLYND 4 MG PO TABS: ORAL_TABLET | ORAL | 4 refills | Status: AC

## 2024-04-05 NOTE — Patient Instructions (Signed)
 Contrast Enhanced Mammogram

## 2024-04-14 ENCOUNTER — Other Ambulatory Visit: Payer: Self-pay | Admitting: Medical Genetics

## 2024-04-14 DIAGNOSIS — Z006 Encounter for examination for normal comparison and control in clinical research program: Secondary | ICD-10-CM

## 2024-05-06 ENCOUNTER — Encounter (INDEPENDENT_AMBULATORY_CARE_PROVIDER_SITE_OTHER): Payer: Self-pay

## 2024-05-23 ENCOUNTER — Other Ambulatory Visit (HOSPITAL_COMMUNITY)
